# Patient Record
Sex: Female | Born: 1960 | Race: White | Hispanic: No | Marital: Married | Smoking: Former smoker
Health system: Southern US, Community
[De-identification: ages and names within clinical notes are randomized; demographics above are authoritative.]

## PROBLEM LIST (undated history)

## (undated) DIAGNOSIS — J45909 Unspecified asthma, uncomplicated: Secondary | ICD-10-CM

## (undated) DIAGNOSIS — N879 Dysplasia of cervix uteri, unspecified: Secondary | ICD-10-CM

## (undated) DIAGNOSIS — F419 Anxiety disorder, unspecified: Secondary | ICD-10-CM

## (undated) DIAGNOSIS — T7840XA Allergy, unspecified, initial encounter: Secondary | ICD-10-CM

## (undated) HISTORY — PX: LASER ABLATION OF THE CERVIX: SHX1949

## (undated) HISTORY — DX: Dysplasia of cervix uteri, unspecified: N87.9

## (undated) HISTORY — PX: DILATION AND CURETTAGE OF UTERUS: SHX78

## (undated) HISTORY — PX: POLYPECTOMY: SHX149

## (undated) HISTORY — DX: Unspecified asthma, uncomplicated: J45.909

## (undated) HISTORY — DX: Anxiety disorder, unspecified: F41.9

## (undated) HISTORY — PX: ENDOMETRIAL ABLATION: SHX621

## (undated) HISTORY — DX: Allergy, unspecified, initial encounter: T78.40XA

## (undated) HISTORY — PX: PELVIC LAPAROSCOPY: SHX162

## (undated) HISTORY — PX: COLONOSCOPY: SHX174

## (undated) HISTORY — PX: GYNECOLOGIC CRYOSURGERY: SHX857

---

## 1992-01-28 HISTORY — PX: COLPOSCOPY: SHX161

## 1999-12-05 ENCOUNTER — Other Ambulatory Visit: Admission: RE | Admit: 1999-12-05 | Discharge: 1999-12-05 | Payer: Self-pay | Admitting: Internal Medicine

## 2000-11-27 ENCOUNTER — Other Ambulatory Visit: Admission: RE | Admit: 2000-11-27 | Discharge: 2000-11-27 | Payer: Self-pay | Admitting: Obstetrics and Gynecology

## 2001-03-26 ENCOUNTER — Ambulatory Visit (HOSPITAL_COMMUNITY): Admission: RE | Admit: 2001-03-26 | Discharge: 2001-03-26 | Payer: Self-pay | Admitting: Obstetrics and Gynecology

## 2002-09-09 ENCOUNTER — Other Ambulatory Visit: Admission: RE | Admit: 2002-09-09 | Discharge: 2002-09-09 | Payer: Self-pay | Admitting: Obstetrics and Gynecology

## 2003-09-15 ENCOUNTER — Ambulatory Visit (HOSPITAL_COMMUNITY): Admission: RE | Admit: 2003-09-15 | Discharge: 2003-09-15 | Payer: Self-pay | Admitting: Obstetrics and Gynecology

## 2005-11-07 ENCOUNTER — Ambulatory Visit (HOSPITAL_COMMUNITY): Admission: RE | Admit: 2005-11-07 | Discharge: 2005-11-07 | Payer: Self-pay | Admitting: Obstetrics and Gynecology

## 2005-11-18 ENCOUNTER — Other Ambulatory Visit: Admission: RE | Admit: 2005-11-18 | Discharge: 2005-11-18 | Payer: Self-pay | Admitting: Obstetrics and Gynecology

## 2006-11-20 ENCOUNTER — Other Ambulatory Visit: Admission: RE | Admit: 2006-11-20 | Discharge: 2006-11-20 | Payer: Self-pay | Admitting: Obstetrics and Gynecology

## 2007-02-09 ENCOUNTER — Ambulatory Visit: Payer: Self-pay | Admitting: Internal Medicine

## 2007-02-09 DIAGNOSIS — R05 Cough: Secondary | ICD-10-CM | POA: Insufficient documentation

## 2007-02-09 DIAGNOSIS — J309 Allergic rhinitis, unspecified: Secondary | ICD-10-CM | POA: Insufficient documentation

## 2007-03-24 ENCOUNTER — Ambulatory Visit: Payer: Self-pay | Admitting: Internal Medicine

## 2007-03-25 ENCOUNTER — Encounter: Payer: Self-pay | Admitting: Internal Medicine

## 2007-12-21 ENCOUNTER — Ambulatory Visit: Payer: Self-pay | Admitting: Obstetrics and Gynecology

## 2007-12-21 ENCOUNTER — Other Ambulatory Visit: Admission: RE | Admit: 2007-12-21 | Discharge: 2007-12-21 | Payer: Self-pay | Admitting: Obstetrics and Gynecology

## 2007-12-21 ENCOUNTER — Encounter: Payer: Self-pay | Admitting: Obstetrics and Gynecology

## 2008-04-24 ENCOUNTER — Ambulatory Visit: Payer: Self-pay | Admitting: Obstetrics and Gynecology

## 2008-08-21 ENCOUNTER — Ambulatory Visit: Payer: Self-pay | Admitting: Obstetrics and Gynecology

## 2008-12-05 ENCOUNTER — Ambulatory Visit (HOSPITAL_COMMUNITY): Admission: RE | Admit: 2008-12-05 | Discharge: 2008-12-05 | Payer: Self-pay | Admitting: Obstetrics and Gynecology

## 2009-03-30 ENCOUNTER — Other Ambulatory Visit: Admission: RE | Admit: 2009-03-30 | Discharge: 2009-03-30 | Payer: Self-pay | Admitting: Obstetrics and Gynecology

## 2009-03-30 ENCOUNTER — Ambulatory Visit: Payer: Self-pay | Admitting: Obstetrics and Gynecology

## 2009-06-07 ENCOUNTER — Ambulatory Visit: Payer: Self-pay | Admitting: Obstetrics and Gynecology

## 2009-06-13 ENCOUNTER — Ambulatory Visit: Payer: Self-pay | Admitting: Obstetrics and Gynecology

## 2010-02-17 ENCOUNTER — Encounter: Payer: Self-pay | Admitting: Internal Medicine

## 2010-06-04 ENCOUNTER — Other Ambulatory Visit (HOSPITAL_COMMUNITY)
Admission: RE | Admit: 2010-06-04 | Discharge: 2010-06-04 | Disposition: A | Discharge status: Home / Self Care | Payer: PRIVATE HEALTH INSURANCE | Source: Ambulatory Visit | Attending: Obstetrics and Gynecology | Admitting: Obstetrics and Gynecology

## 2010-06-04 ENCOUNTER — Encounter (INDEPENDENT_AMBULATORY_CARE_PROVIDER_SITE_OTHER): Payer: PRIVATE HEALTH INSURANCE | Admitting: Obstetrics and Gynecology

## 2010-06-04 ENCOUNTER — Other Ambulatory Visit: Payer: Self-pay | Admitting: Obstetrics and Gynecology

## 2010-06-04 DIAGNOSIS — R823 Hemoglobinuria: Secondary | ICD-10-CM

## 2010-06-04 DIAGNOSIS — Z01419 Encounter for gynecological examination (general) (routine) without abnormal findings: Secondary | ICD-10-CM

## 2010-06-04 DIAGNOSIS — Z833 Family history of diabetes mellitus: Secondary | ICD-10-CM

## 2010-06-04 DIAGNOSIS — Z124 Encounter for screening for malignant neoplasm of cervix: Secondary | ICD-10-CM | POA: Insufficient documentation

## 2010-06-14 NOTE — Op Note (Signed)
[facility]  Patient:    Shelly Mcgee, Shelly Mcgee Visit Number: [id number] MRN: [id number]          Service Type: DSU Location: DAY Attending Physician:  Sharon Mt Dictated by:   Rande Brunt. Eda Paschal, M.D. Proc. Date: 03/26/01 Admit Date:  03/26/2001                             Operative Report  PREOPERATIVE DIAGNOSIS:  Pelvic pain with endometriosis suspected.  POSTOPERATIVE DIAGNOSIS:  Pelvic pain, endometriosis.  OPERATION PERFORMED:  Diagnostic laparoscopy with laser vaporization of endometriosis.  SURGEON:  Daniel L. Eda Paschal, M.D.  ANESTHESIA:  General endotracheal.  INDICATIONS FOR PROCEDURE:  The patient is a 50 year old female with progressively increasing right lower quadrant pain who enters the hospital for laparoscopy and appropriate treatment. She has a past history of endometriosis and therefore this is the most likely diagnosis. In addition, she underwent laparoscopic examination of an endometrioma and the other thought is that she may have pelvic adhesive disease from a previous ovarian cystectomy. She now enters for the above.  FINDINGS:  At the time of laparoscopy, the patient was completely devoid of any pelvic adhesive disease except the vesicouterine fold to peritoneum had some from a previous cesarean section. The patient had 5 distinct areas of endometriosis that were all nonpigmented. They were either clear or reddish. The largest area was in the cul-de-sac on the right and was about 3 cm. It was more superficial than deep. There was also an area of endometriosis on 2 or 3 spots of her uterus again more superficial then deep. The fifth area was on her left ovary and again was more superficial than deep. Total areas involved probably cumulatively was about 5 cm worth. The fallopian tubes were normal. The fimbria were normal with luxuriant fimbria, tubes were patent when indigo carmine was utilized. The ileocecal junction  was identified and the appendix was normal. The right upper quadrant was visualized and it was normal.  DESCRIPTION OF PROCEDURE:  After adequate general endotracheal anesthesia, the patient was placed in the dorsal lithotomy position and prepped and draped in the usual sterile manner. A Robinson catheter was used to empty the bladder, a Jarcho cannula was placed in the uterus, a pneumoperitoneum was created with a Veress needle, 3.5 liters of carbon dioxide were utilized. A 10 mm trocar was placed subumbilically. A 5 mm port was placed in the pelvis under direct visualization once the operating scope with the camera was placed. The pelvis was visualized and was noted above. Pictures were taken for documentation. The neodymium YAG laser using a GR4 tip at 12 watts power was utilized. All areas of endometriosis were completely lasered, none of them were near dangerous surrounding structures. Copious irrigation was done with Ringers lactate. At the termination of the procedure, there was no bleeding noted. All endometriosis was gone, cul-de-sac fluid was removed. Chromotubation was done with indigo carmine and both tubes were patent. At the termination of the procedure, the pneumoperitoneum was evacuated, the subumbilical fascial incision was closed with #0 Vicryl and the skin incisions were closed with 4-0 monocryl. The patient left the operating room in satisfactory condition. Blood loss was less than 50 cc. Dictated by:   Rande Brunt. Eda Paschal, M.D. Attending Physician:  Sharon Mt DD:  03/26/01 TD:  03/26/01 Job: [phone] [id number]

## 2010-12-26 ENCOUNTER — Other Ambulatory Visit: Payer: Self-pay

## 2010-12-26 MED ORDER — TERCONAZOLE 0.8 % VA CREA: 1.0000 | TOPICAL_CREAM | Freq: Every day | VAGINAL | Status: AC

## 2010-12-26 NOTE — Telephone Encounter (Signed)
RX SENT TO HER PHARMACY.

## 2010-12-26 NOTE — Telephone Encounter (Signed)
Okay to give her generic Terazol 3 cream with 5 refills.

## 2010-12-26 NOTE — Telephone Encounter (Signed)
C-O VAGINAL ITCHING AND REQUESTING REFILLS ON GENERIC TERAZOL 3 YOU GAVE HER LAST YEAR THAT HAVE EXPIRED.

## 2011-05-21 ENCOUNTER — Other Ambulatory Visit: Payer: Self-pay | Admitting: Obstetrics and Gynecology

## 2011-05-21 DIAGNOSIS — Z1231 Encounter for screening mammogram for malignant neoplasm of breast: Secondary | ICD-10-CM

## 2011-06-18 ENCOUNTER — Ambulatory Visit (HOSPITAL_COMMUNITY)
Admission: RE | Admit: 2011-06-18 | Discharge: 2011-06-18 | Disposition: A | Discharge status: Home / Self Care | Payer: PRIVATE HEALTH INSURANCE | Source: Ambulatory Visit | Attending: Obstetrics and Gynecology | Admitting: Obstetrics and Gynecology

## 2011-06-18 DIAGNOSIS — Z1231 Encounter for screening mammogram for malignant neoplasm of breast: Secondary | ICD-10-CM

## 2011-06-27 ENCOUNTER — Encounter: Payer: Self-pay | Admitting: Gynecology

## 2011-06-27 DIAGNOSIS — N809 Endometriosis, unspecified: Secondary | ICD-10-CM | POA: Insufficient documentation

## 2011-07-10 ENCOUNTER — Other Ambulatory Visit (HOSPITAL_COMMUNITY)
Admission: RE | Admit: 2011-07-10 | Discharge: 2011-07-10 | Disposition: A | Discharge status: Home / Self Care | Payer: PRIVATE HEALTH INSURANCE | Source: Ambulatory Visit | Attending: Obstetrics and Gynecology | Admitting: Obstetrics and Gynecology

## 2011-07-10 ENCOUNTER — Ambulatory Visit (INDEPENDENT_AMBULATORY_CARE_PROVIDER_SITE_OTHER): Payer: PRIVATE HEALTH INSURANCE | Admitting: Obstetrics and Gynecology

## 2011-07-10 ENCOUNTER — Encounter: Payer: Self-pay | Admitting: Obstetrics and Gynecology

## 2011-07-10 VITALS — BP 134/84 | Ht 66.0 in | Wt 148.0 lb

## 2011-07-10 DIAGNOSIS — Z01419 Encounter for gynecological examination (general) (routine) without abnormal findings: Secondary | ICD-10-CM

## 2011-07-10 DIAGNOSIS — N879 Dysplasia of cervix uteri, unspecified: Secondary | ICD-10-CM | POA: Insufficient documentation

## 2011-07-10 DIAGNOSIS — F419 Anxiety disorder, unspecified: Secondary | ICD-10-CM | POA: Insufficient documentation

## 2011-07-10 DIAGNOSIS — Z833 Family history of diabetes mellitus: Secondary | ICD-10-CM

## 2011-07-10 LAB — CBC WITH DIFFERENTIAL/PLATELET
Basophils Absolute: 0.1 10*3/uL (ref 0.0–0.1)
Basophils Relative: 1 % (ref 0–1)
Eosinophils Absolute: 0 10*3/uL (ref 0.0–0.7)
Eosinophils Relative: 0 % (ref 0–5)
HCT: 40.7 % (ref 36.0–46.0)
Hemoglobin: 13.6 g/dL (ref 12.0–15.0)
Lymphocytes Relative: 22 % (ref 12–46)
MCH: 30.4 pg (ref 26.0–34.0)
MCHC: 33.4 g/dL (ref 30.0–36.0)
Platelets: 187 10*3/uL (ref 150–400)
RBC: 4.47 MIL/uL (ref 3.87–5.11)
RDW: 12.8 % (ref 11.5–15.5)

## 2011-07-10 LAB — HEMOGLOBIN A1C
Hgb A1c MFr Bld: 5.3 % (ref <5.7)
Mean Plasma Glucose: 105 mg/dL (ref <117)

## 2011-07-10 NOTE — Progress Notes (Signed)
Patient came to see me today for her annual GYN exam. Her cycles are regular and are not uncomfortable. She does have occasional night sweats. She is under a lot of stress with her daughter. She is having no pelvic pain. She is having no abnormal bleeding. She is up-to-date on mammograms. 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 She understands the new guidelines and requested a Pap today.  Physical examination: Kennon Portela present. HEENT within normal limits. Neck: Thyroid not large. No masses. Supraclavicular nodes: not enlarged. Breasts: Examined in both sitting and lying  position. No skin changes and no masses. Abdomen: Soft no guarding rebound or masses or hernia. Pelvic: External: Within normal limits. BUS: Within normal limits. Vaginal:within normal limits. Good estrogen effect. No evidence of cystocele rectocele or enterocele. Cervix: clean. Uterus: Normal size and shape. Adnexa: No masses. Rectovaginal exam: Confirmatory and negative. Extremities: Within normal limits.  Assessment: Normal GYN exam  Plan: Appropriate lab work done. Continue yearly mammograms. 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

## 2011-07-10 NOTE — Addendum Note (Signed)
Addended by: Dayna Barker on: 07/10/2011 09:34 AM   Modules accepted: Orders

## 2011-07-11 LAB — URINALYSIS W MICROSCOPIC + REFLEX CULTURE
Bilirubin Urine: NEGATIVE
Nitrite: NEGATIVE
Protein, ur: NEGATIVE mg/dL
Specific Gravity, Urine: 1.023 (ref 1.005–1.030)
Urobilinogen, UA: 0.2 mg/dL (ref 0.0–1.0)

## 2011-07-11 LAB — LIPID PANEL: HDL: 62 mg/dL (ref >39)

## 2011-07-13 LAB — URINE CULTURE: Colony Count: 40000

## 2011-07-14 ENCOUNTER — Other Ambulatory Visit: Payer: Self-pay | Admitting: Obstetrics and Gynecology

## 2011-07-14 DIAGNOSIS — N390 Urinary tract infection, site not specified: Secondary | ICD-10-CM

## 2011-07-14 MED ORDER — NITROFURANTOIN MONOHYD MACRO 100 MG PO CAPS: 100.0000 mg | ORAL_CAPSULE | Freq: Two times a day (BID) | ORAL | Status: DC

## 2011-07-21 ENCOUNTER — Other Ambulatory Visit: Payer: PRIVATE HEALTH INSURANCE

## 2011-07-21 DIAGNOSIS — N390 Urinary tract infection, site not specified: Secondary | ICD-10-CM

## 2011-07-22 LAB — URINALYSIS W MICROSCOPIC + REFLEX CULTURE
Hgb urine dipstick: NEGATIVE
Ketones, ur: NEGATIVE mg/dL
Leukocytes, UA: NEGATIVE
Nitrite: NEGATIVE
Protein, ur: NEGATIVE mg/dL
Urobilinogen, UA: 0.2 mg/dL (ref 0.0–1.0)

## 2011-08-08 ENCOUNTER — Telehealth: Payer: Self-pay | Admitting: *Deleted

## 2011-08-08 NOTE — Telephone Encounter (Signed)
Pt informed of recent results, copy mailed to pt as well.

## 2011-11-29 ENCOUNTER — Other Ambulatory Visit: Payer: Self-pay | Admitting: Obstetrics and Gynecology

## 2011-12-23 ENCOUNTER — Encounter: Payer: Self-pay | Admitting: Family Medicine

## 2012-03-24 ENCOUNTER — Ambulatory Visit (INDEPENDENT_AMBULATORY_CARE_PROVIDER_SITE_OTHER): Payer: PRIVATE HEALTH INSURANCE | Admitting: Emergency Medicine

## 2012-03-24 VITALS — BP 151/96 | HR 58 | Temp 97.9°F | Resp 16 | Ht 65.5 in | Wt 148.8 lb

## 2012-03-24 DIAGNOSIS — N201 Calculus of ureter: Secondary | ICD-10-CM

## 2012-03-24 DIAGNOSIS — R350 Frequency of micturition: Secondary | ICD-10-CM

## 2012-03-24 LAB — POCT UA - MICROSCOPIC ONLY
Casts, Ur, LPF, POC: NEGATIVE
Mucus, UA: NEGATIVE
Yeast, UA: NEGATIVE

## 2012-03-24 LAB — POCT URINALYSIS DIPSTICK
Bilirubin, UA: NEGATIVE
Ketones, UA: NEGATIVE
Leukocytes, UA: NEGATIVE
Nitrite, UA: NEGATIVE

## 2012-03-24 NOTE — Patient Instructions (Signed)
Ureteral Colic (Kidney Stones) Ureteral colic is the result of a condition when kidney stones form inside the kidney. Once kidney stones are formed they may move into the tube that connects the kidney with the bladder (ureter). If this occurs, this condition may cause pain (colic) in the ureter.  CAUSES  Pain is caused by stone movement in the ureter and the obstruction caused by the stone. SYMPTOMS  The pain comes and goes as the ureter contracts around the stone. The pain is usually intense, sharp, and stabbing in character. The location of the pain may move as the stone moves through the ureter. When the stone is near the kidney the pain is usually located in the back and radiates to the belly (abdomen). When the stone is ready to pass into the bladder the pain is often located in the lower abdomen on the side the stone is located. At this location, the symptoms may mimic those of a urinary tract infection with urinary frequency. Once the stone is located here it often passes into the bladder and the pain disappears completely. TREATMENT   Your caregiver will provide you with medicine for pain relief.  You may require specialized follow-up X-rays.  The absence of pain does not always mean that the stone has passed. It may have just stopped moving. If the urine remains completely obstructed, it can cause loss of kidney function or even complete destruction of the involved kidney. It is your responsibility and in your interest that X-rays and follow-ups as suggested by your caregiver are completed. Relief of pain without passage of the stone can be associated with severe damage to the kidney, including loss of kidney function on that side.  If your stone does not pass on its own, additional measures may be taken by your caregiver to ensure its removal. HOME CARE INSTRUCTIONS   Increase your fluid intake. Water is the preferred fluid since juices containing vitamin C may acidify the urine making it  less likely for certain stones (uric acid stones) to pass.  Strain all urine. A strainer will be provided. Keep all particulate matter or stones for your caregiver to inspect.  Take your pain medicine as directed.  Make a follow-up appointment with your caregiver as directed.  Remember that the goal is passage of your stone. The absence of pain does not mean the stone is gone. Follow your caregiver's instructions.  Only take over-the-counter or prescription medicines for pain, discomfort, or fever as directed by your caregiver. SEEK MEDICAL CARE IF:   Pain cannot be controlled with the prescribed medicine.  You have a fever.  Pain continues for longer than your caregiver advises it should.  There is a change in the pain, and you develop chest discomfort or constant abdominal pain.  You feel faint or pass out. MAKE SURE YOU:   Understand these instructions.  Will watch your condition.  Will get help right away if you are not doing well or get worse. Document Released: 10/23/2004 Document Revised: 04/07/2011 Document Reviewed: 07/10/2010 Exit[employer] Patient Information 2013 [employer].  

## 2012-03-24 NOTE — Addendum Note (Signed)
Addended by: Carmelina Dane on: 03/24/2012 08:50 PM   Modules accepted: Orders

## 2012-03-24 NOTE — Progress Notes (Signed)
[facility] [address] [place] [address] [phone]  Date:  03/24/2012   Name:  Shelly Mcgee   DOB:  1960/03/03   MRN:  [id number]  PCP:  Trellis Paganini, MD    Chief Complaint: Urinary Tract Infection   History of Present Illness:  Shelly Mcgee is a 52 y.o. very pleasant female patient who presents with the following:  Dysuria urgency and frequency for past several days despite self treatment with macrobid.  Started with severe left flank pain on Sunday that kept her awake a good part of the night.  History of frequent cystitis in past.  Now has malaise and myalgias, fatigue.  Some back pain into left groin.  No dyspareunia, discharge or vaginal bleeding.  Patient Active Problem List  Diagnosis  . ALLERGIC RHINITIS  . COUGH  . Endometriosis  . Anxiety  . Cervical dysplasia    Past Medical History  Diagnosis Date  . Endometriosis   . Anxiety   . Cervical dysplasia     Past Surgical History  Procedure Laterality Date  . Cesarean section    . Pelvic laparoscopy      DL laser endometriosis  . Laser ablation of the cervix    . Dilation and curettage of uterus    . Endometrial ablation    . Gynecologic cryosurgery    . Colposcopy  1994    History  Substance Use Topics  . Smoking status: Former Games developer  . Smokeless tobacco: Not on file  . Alcohol Use: 3.0 oz/week    6 drink(s) per week    Family History  Problem Relation Age of Onset  . Diabetes Mother   . Hypertension Mother   . Diabetes Father   . Diabetes Brother   . Diabetes Maternal Grandmother   . Diabetes Maternal Aunt   . Diabetes Maternal Uncle     Allergies  Allergen Reactions  . Wasp Venom   . Yellow Jacket Venom (Bee Venom)     Medication list has been reviewed and updated.  Current Outpatient Prescriptions on File Prior to Visit  Medication Sig Dispense Refill  . glucosamine-chondroitin 500-400 MG tablet Take 1 tablet by mouth daily.      . IBUPROFEN PO  Take by mouth.      . nitrofurantoin, macrocrystal-monohydrate, (MACROBID) 100 MG capsule TAKE ONE CAPSULE BY MOUTH TWICE DAILY WITH FOOD FOR 7 DAYS  14 capsule  0   No current facility-administered medications on file prior to visit.    Review of Systems:  As per HPI, otherwise negative.    Physical Examination: Filed Vitals:   03/24/12 2014  BP: 151/96  Pulse: 58  Temp: 97.9 F (36.6 C)  Resp: 16   Filed Vitals:   03/24/12 2014  Height: 5' 5.5" (1.664 m)  Weight: 148 lb 12.8 oz (67.495 kg)   Body mass index is 24.38 kg/(m^2). Ideal Body Weight: Weight in (lb) to have BMI = 25: 152.2  GEN: WDWN, NAD, Non-toxic, A & O x 3 HEENT: Atraumatic, Normocephalic. Neck supple. No masses, No LAD. Ears and Nose: No external deformity. CV: RRR, No M/G/R. No JVD. No thrill. No extra heart sounds. PULM: CTA B, no wheezes, crackles, rhonchi. No retractions. No resp. distress. No accessory muscle use. ABD: S, NT, ND, +BS. No rebound. No HSM. EXTR: No c/c/e NEURO Normal gait.  PSYCH: Normally interactive. Conversant. Not depressed or anxious appearing.  Calm demeanor.    Assessment and Plan:  Hematuria Resolved flank pain Likely ureterolithiasis Follow up two weeks for repeat UA Urine C&S   Carmelina Dane, MD   Results for orders placed in visit on 03/24/12  POCT URINALYSIS DIPSTICK      Result Value Range   Color, UA yellow     Clarity, UA clear     Glucose, UA neg     Bilirubin, UA neg     Ketones, UA neg     Spec Grav, UA >=1.030     Blood, UA large     pH, UA 6.0     Protein, UA neg'     Urobilinogen, UA 0.2     Nitrite, UA neg     Leukocytes, UA Negative    POCT UA - MICROSCOPIC ONLY      Result Value Range   WBC, Ur, HPF, POC 0-2     RBC, urine, microscopic tntc     Bacteria, U Microscopic 1+     Mucus, UA neg     Epithelial cells, urine per micros 0-1     Crystals, Ur, HPF, POC neg     Casts, Ur, LPF, POC neg     Yeast, UA neg

## 2012-03-25 ENCOUNTER — Ambulatory Visit: Payer: Self-pay | Admitting: Physician Assistant

## 2012-03-27 LAB — URINE CULTURE: Organism ID, Bacteria: NO GROWTH

## 2012-04-09 ENCOUNTER — Ambulatory Visit: Payer: PRIVATE HEALTH INSURANCE | Admitting: Family Medicine

## 2012-07-12 ENCOUNTER — Encounter: Payer: PRIVATE HEALTH INSURANCE | Admitting: Family Medicine

## 2012-08-03 ENCOUNTER — Encounter: Payer: Self-pay | Admitting: Family Medicine

## 2012-08-03 ENCOUNTER — Other Ambulatory Visit (HOSPITAL_COMMUNITY)
Admission: RE | Admit: 2012-08-03 | Discharge: 2012-08-03 | Disposition: A | Discharge status: Home / Self Care | Payer: PRIVATE HEALTH INSURANCE | Source: Ambulatory Visit | Attending: Family Medicine | Admitting: Family Medicine

## 2012-08-03 ENCOUNTER — Ambulatory Visit (INDEPENDENT_AMBULATORY_CARE_PROVIDER_SITE_OTHER): Payer: PRIVATE HEALTH INSURANCE | Admitting: Family Medicine

## 2012-08-03 VITALS — BP 126/78 | HR 63 | Temp 98.4°F | Ht 65.5 in | Wt 144.6 lb

## 2012-08-03 DIAGNOSIS — H1045 Other chronic allergic conjunctivitis: Secondary | ICD-10-CM

## 2012-08-03 DIAGNOSIS — Z01419 Encounter for gynecological examination (general) (routine) without abnormal findings: Secondary | ICD-10-CM | POA: Insufficient documentation

## 2012-08-03 DIAGNOSIS — Z124 Encounter for screening for malignant neoplasm of cervix: Secondary | ICD-10-CM

## 2012-08-03 DIAGNOSIS — J309 Allergic rhinitis, unspecified: Secondary | ICD-10-CM

## 2012-08-03 DIAGNOSIS — R05 Cough: Secondary | ICD-10-CM

## 2012-08-03 DIAGNOSIS — Z0000 Encounter for general adult medical examination without abnormal findings: Secondary | ICD-10-CM

## 2012-08-03 DIAGNOSIS — Z1151 Encounter for screening for human papillomavirus (HPV): Secondary | ICD-10-CM | POA: Insufficient documentation

## 2012-08-03 DIAGNOSIS — H1010 Acute atopic conjunctivitis, unspecified eye: Secondary | ICD-10-CM

## 2012-08-03 DIAGNOSIS — J302 Other seasonal allergic rhinitis: Secondary | ICD-10-CM

## 2012-08-03 LAB — HEPATIC FUNCTION PANEL
ALT: 16 U/L (ref 0–35)
AST: 25 U/L (ref 0–37)
Albumin: 4.4 g/dL (ref 3.5–5.2)
Alkaline Phosphatase: 42 U/L (ref 39–117)
Total Protein: 7.2 g/dL (ref 6.0–8.3)

## 2012-08-03 LAB — POCT URINALYSIS DIPSTICK
Bilirubin, UA: NEGATIVE
Glucose, UA: NEGATIVE
Ketones, UA: NEGATIVE
Leukocytes, UA: NEGATIVE
Protein, UA: NEGATIVE
Spec Grav, UA: 1.015

## 2012-08-03 LAB — CBC WITH DIFFERENTIAL/PLATELET
Basophils Relative: 1.4 % (ref 0.0–3.0)
Eosinophils Relative: 0.3 % (ref 0.0–5.0)
HCT: 38.1 % (ref 36.0–46.0)
Hemoglobin: 12.9 g/dL (ref 12.0–15.0)
Lymphocytes Relative: 24.5 % (ref 12.0–46.0)
Lymphs Abs: 1.3 10*3/uL (ref 0.7–4.0)
Monocytes Relative: 7.6 % (ref 3.0–12.0)
Neutro Abs: 3.6 10*3/uL (ref 1.4–7.7)
RBC: 4.12 Mil/uL (ref 3.87–5.11)
RDW: 12.7 % (ref 11.5–14.6)

## 2012-08-03 LAB — BASIC METABOLIC PANEL
CO2: 24 mEq/L (ref 19–32)
Calcium: 9.1 mg/dL (ref 8.4–10.5)
GFR: 93.83 mL/min (ref >60.00)
Glucose, Bld: 94 mg/dL (ref 70–99)
Potassium: 3.5 mEq/L (ref 3.5–5.1)
Sodium: 138 mEq/L (ref 135–145)

## 2012-08-03 LAB — LIPID PANEL
Cholesterol: 181 mg/dL (ref 0–200)
VLDL: 13.2 mg/dL (ref 0.0–40.0)

## 2012-08-03 LAB — TSH: TSH: 1.17 u[IU]/mL (ref 0.35–5.50)

## 2012-08-03 MED ORDER — RANITIDINE HCL 150 MG PO TABS: 150.0000 mg | ORAL_TABLET | Freq: Two times a day (BID) | ORAL | Status: DC

## 2012-08-03 NOTE — Progress Notes (Signed)
Subjective:     Shelly Mcgee is a 52 y.o. female and is here for a comprehensive physical exam. The patient reports no current problems-- she occassionally has LB pain from bulging disc at L5S1.---she is also taking zantac 150 qd for dyspepsia and using saline rinse for allergies  History   Social History  . Marital Status: Married    Spouse Name: N/A    Number of Children: N/A  . Years of Education: N/A   Occupational History  . [employer]--[address]    Social History Main Topics  . Smoking status: Former Smoker -- 0.50 packs/day for 6 years    Quit date: 08/04/1982  . Smokeless tobacco: Not on file  . Alcohol Use: 3.0 oz/week    6 drink(s) per week  . Drug Use: No  . Sexually Active: Yes -- Female partner(s)    Birth Control/ Protection: None   Other Topics Concern  . Not on file   Social History Narrative   Exercise-- yoga 3x aweek ,  Walks 10 miles a week   Health Maintenance  Topic Date Due  . Mammogram  10/03/2011  . Colonoscopy  10/03/2011  . Pap Smear  07/09/2012  . Influenza Vaccine  09/27/2012  . Tetanus/tdap  06/03/2020    The following portions of the patient's history were reviewed and updated as appropriate:  She  has a past medical history of Endometriosis; Anxiety; and Cervical dysplasia. She  does not have any pertinent problems on file. She  has past surgical history that includes Cesarean section; Pelvic laparoscopy; Laser ablation of the cervix; Dilation and curettage of uterus; Endometrial ablation; Gynecologic cryosurgery; and Colposcopy (1994). Her family history includes Diabetes in her brother, father, maternal aunt, maternal grandmother, maternal uncle, and mother; Hypertension in her mother; and Thyroid disease in her paternal grandmother and sister. She  reports that she quit smoking about 30 years ago. She does not have any smokeless tobacco history on file. She reports that she drinks about 3.0 ounces of alcohol per week. She reports that  she does not use illicit drugs. She has a current medication list which includes the following prescription(s): glucosamine-chondroitin, ibuprofen, and ranitidine. Current Outpatient Prescriptions on File Prior to Visit  Medication Sig Dispense Refill  . glucosamine-chondroitin 500-400 MG tablet Take 1 tablet by mouth daily.      . IBUPROFEN PO Take by mouth.       No current facility-administered medications on file prior to visit.   She is allergic to wasp venom and yellow jacket venom..  Review of Systems Review of Systems  Constitutional: Negative for activity change, appetite change and fatigue.  HENT: Negative for hearing loss, congestion, tinnitus and ear discharge.  dentist q56m Eyes: Negative for visual disturbance (see optho- due).  Respiratory: Negative for cough, chest tightness and shortness of breath.   Cardiovascular: Negative for chest pain, palpitations and leg swelling.  Gastrointestinal: Negative for abdominal pain, diarrhea, constipation and abdominal distention.  Genitourinary: Negative for urgency, frequency, decreased urine volume and difficulty urinating.  Musculoskeletal: Negative for back pain, arthralgias and gait problem.  Skin: Negative for color change, pallor and rash.  Neurological: Negative for dizziness, light-headedness, numbness and headaches.  Hematological: Negative for adenopathy. Does not bruise/bleed easily.  Psychiatric/Behavioral: Negative for suicidal ideas, confusion, sleep disturbance, self-injury, dysphoric mood, decreased concentration and agitation.       Objective:    BP 126/78  Pulse 63  Temp(Src) 98.4 F (36.9 C) (Oral)  Ht 5'  5.5" (1.664 m)  Wt 144 lb 9.6 oz (65.59 kg)  BMI 23.69 kg/m2  SpO2 98%  LMP 07/20/2012 General appearance: alert, cooperative, appears stated age and no distress Head: Normocephalic, without obvious abnormality, atraumatic Eyes: conjunctivae/corneas clear. PERRL, EOM's intact. Fundi benign. Ears:  normal TM's and external ear canals both ears Nose: Nares normal. Septum midline. Mucosa normal. No drainage or sinus tenderness. Throat: lips, mucosa, and tongue normal; teeth and gums normal Neck: no adenopathy, no carotid bruit, no JVD, supple, symmetrical, trachea midline and thyroid not enlarged, symmetric, no tenderness/mass/nodules Back: symmetric, no curvature. ROM normal. No CVA tenderness. Lungs: clear to auscultation bilaterally Breasts: normal appearance, no masses or tenderness Heart: regular rate and rhythm, S1, S2 normal, no murmur, click, rub or gallop Abdomen: soft, non-tender; bowel sounds normal; no masses,  no organomegaly Pelvic: cervix normal in appearance, external genitalia normal, no adnexal masses or tenderness, no cervical motion tenderness, rectovaginal septum normal, uterus normal size, shape, and consistency and vagina normal without discharge-pap done Extremities: extremities normal, atraumatic, no cyanosis or edema Pulses: 2+ and symmetric Skin: Skin color, texture, turgor normal. No rashes or lesions Lymph nodes: Cervical, supraclavicular, and axillary nodes normal. Neurologic: Alert and oriented X 3, normal strength and tone. Normal symmetric reflexes. Normal coordination and gait Psych-- no depression, no anxiety      Assessment:    Healthy female exam.      Plan:    check labs ghm ut See After Visit Summary for Counseling Recommendations

## 2012-08-03 NOTE — Patient Instructions (Addendum)
Preventive Care for Adults, Female A healthy lifestyle and preventive care can promote health and wellness. Preventive health guidelines for women include the following key practices.  A routine yearly physical is a good way to check with your caregiver about your health and preventive screening. It is a chance to share any concerns and updates on your health, and to receive a thorough exam.  Visit your dentist for a routine exam and preventive care every 6 months. Brush your teeth twice a day and floss once a day. Good oral hygiene prevents tooth decay and gum disease.  The frequency of eye exams is based on your age, health, family medical history, use of contact lenses, and other factors. Follow your caregiver's recommendations for frequency of eye exams.  Eat a healthy diet. Foods like vegetables, fruits, whole grains, low-fat dairy products, and lean protein foods contain the nutrients you need without too many calories. Decrease your intake of foods high in solid fats, added sugars, and salt. Eat the right amount of calories for you.Get information about a proper diet from your caregiver, if necessary.  Regular physical exercise is one of the most important things you can do for your health. Most adults should get at least 150 minutes of moderate-intensity exercise (any activity that increases your heart rate and causes you to sweat) each week. In addition, most adults need muscle-strengthening exercises on 2 or more days a week.  Maintain a healthy weight. The body mass index (BMI) is a screening tool to identify possible weight problems. It provides an estimate of body fat based on height and weight. Your caregiver can help determine your BMI, and can help you achieve or maintain a healthy weight.For adults 20 years and older:  A BMI below 18.5 is considered underweight.  A BMI of 18.5 to 24.9 is normal.  A BMI of 25 to 29.9 is considered overweight.  A BMI of 30 and above is  considered obese.  Maintain normal blood lipids and cholesterol levels by exercising and minimizing your intake of saturated fat. Eat a balanced diet with plenty of fruit and vegetables. Blood tests for lipids and cholesterol should begin at age 20 and be repeated every 5 years. If your lipid or cholesterol levels are high, you are over 50, or you are at high risk for heart disease, you may need your cholesterol levels checked more frequently.Ongoing high lipid and cholesterol levels should be treated with medicines if diet and exercise are not effective.  If you smoke, find out from your caregiver how to quit. If you do not use tobacco, do not start.  If you are pregnant, do not drink alcohol. If you are breastfeeding, be very cautious about drinking alcohol. If you are not pregnant and choose to drink alcohol, do not exceed 1 drink per day. One drink is considered to be 12 ounces (355 mL) of beer, 5 ounces (148 mL) of wine, or 1.5 ounces (44 mL) of liquor.  Avoid use of street drugs. Do not share needles with anyone. Ask for help if you need support or instructions about stopping the use of drugs.  High blood pressure causes heart disease and increases the risk of stroke. Your blood pressure should be checked at least every 1 to 2 years. Ongoing high blood pressure should be treated with medicines if weight loss and exercise are not effective.  If you are 55 to 51 years old, ask your caregiver if you should take aspirin to prevent strokes.  Diabetes   screening involves taking a blood sample to check your fasting blood sugar level. This should be done once every 3 years, after age 45, if you are within normal weight and without risk factors for diabetes. Testing should be considered at a younger age or be carried out more frequently if you are overweight and have at least 1 risk factor for diabetes.  Breast cancer screening is essential preventive care for women. You should practice "breast  self-awareness." This means understanding the normal appearance and feel of your breasts and may include breast self-examination. Any changes detected, no matter how small, should be reported to a caregiver. Women in their 20s and 30s should have a clinical breast exam (CBE) by a caregiver as part of a regular health exam every 1 to 3 years. After age 40, women should have a CBE every year. Starting at age 40, women should consider having a mammography (breast X-ray test) every year. Women who have a family history of breast cancer should talk to their caregiver about genetic screening. Women at a high risk of breast cancer should talk to their caregivers about having magnetic resonance imaging (MRI) and a mammography every year.  The Pap test is a screening test for cervical cancer. A Pap test can show cell changes on the cervix that might become cervical cancer if left untreated. A Pap test is a procedure in which cells are obtained and examined from the lower end of the uterus (cervix).  Women should have a Pap test starting at age 21.  Between ages 21 and 29, Pap tests should be repeated every 2 years.  Beginning at age 30, you should have a Pap test every 3 years as long as the past 3 Pap tests have been normal.  Some women have medical problems that increase the chance of getting cervical cancer. Talk to your caregiver about these problems. It is especially important to talk to your caregiver if a new problem develops soon after your last Pap test. In these cases, your caregiver may recommend more frequent screening and Pap tests.  The above recommendations are the same for women who have or have not gotten the vaccine for human papillomavirus (HPV).  If you had a hysterectomy for a problem that was not cancer or a condition that could lead to cancer, then you no longer need Pap tests. Even if you no longer need a Pap test, a regular exam is a good idea to make sure no other problems are  starting.  If you are between ages 65 and 70, and you have had normal Pap tests going back 10 years, you no longer need Pap tests. Even if you no longer need a Pap test, a regular exam is a good idea to make sure no other problems are starting.  If you have had past treatment for cervical cancer or a condition that could lead to cancer, you need Pap tests and screening for cancer for at least 20 years after your treatment.  If Pap tests have been discontinued, risk factors (such as a new sexual partner) need to be reassessed to determine if screening should be resumed.  The HPV test is an additional test that may be used for cervical cancer screening. The HPV test looks for the virus that can cause the cell changes on the cervix. The cells collected during the Pap test can be tested for HPV. The HPV test could be used to screen women aged 30 years and older, and should   be used in women of any age who have unclear Pap test results. After the age of 30, women should have HPV testing at the same frequency as a Pap test.  Colorectal cancer can be detected and often prevented. Most routine colorectal cancer screening begins at the age of 50 and continues through age 75. However, your caregiver may recommend screening at an earlier age if you have risk factors for colon cancer. On a yearly basis, your caregiver may provide home test kits to check for hidden blood in the stool. Use of a small camera at the end of a tube, to directly examine the colon (sigmoidoscopy or colonoscopy), can detect the earliest forms of colorectal cancer. Talk to your caregiver about this at age 50, when routine screening begins. Direct examination of the colon should be repeated every 5 to 10 years through age 75, unless early forms of pre-cancerous polyps or small growths are found.  Hepatitis C blood testing is recommended for all people born from 1945 through 1965 and any individual with known risks for hepatitis C.  Practice  safe sex. Use condoms and avoid high-risk sexual practices to reduce the spread of sexually transmitted infections (STIs). STIs include gonorrhea, chlamydia, syphilis, trichomonas, herpes, HPV, and human immunodeficiency virus (HIV). Herpes, HIV, and HPV are viral illnesses that have no cure. They can result in disability, cancer, and death. Sexually active women aged 25 and younger should be checked for chlamydia. Older women with new or multiple partners should also be tested for chlamydia. Testing for other STIs is recommended if you are sexually active and at increased risk.  Osteoporosis is a disease in which the bones lose minerals and strength with aging. This can result in serious bone fractures. The risk of osteoporosis can be identified using a bone density scan. Women ages 65 and over and women at risk for fractures or osteoporosis should discuss screening with their caregivers. Ask your caregiver whether you should take a calcium supplement or vitamin D to reduce the rate of osteoporosis.  Menopause can be associated with physical symptoms and risks. Hormone replacement therapy is available to decrease symptoms and risks. You should talk to your caregiver about whether hormone replacement therapy is right for you.  Use sunscreen with sun protection factor (SPF) of 30 or more. Apply sunscreen liberally and repeatedly throughout the day. You should seek shade when your shadow is shorter than you. Protect yourself by wearing long sleeves, pants, a wide-brimmed hat, and sunglasses year round, whenever you are outdoors.  Once a month, do a whole body skin exam, using a mirror to look at the skin on your back. Notify your caregiver of new moles, moles that have irregular borders, moles that are larger than a pencil eraser, or moles that have changed in shape or color.  Stay current with required immunizations.  Influenza. You need a dose every fall (or winter). The composition of the flu vaccine  changes each year, so being vaccinated once is not enough.  Pneumococcal polysaccharide. You need 1 to 2 doses if you smoke cigarettes or if you have certain chronic medical conditions. You need 1 dose at age 65 (or older) if you have never been vaccinated.  Tetanus, diphtheria, pertussis (Tdap, Td). Get 1 dose of Tdap vaccine if you are younger than age 65, are over 65 and have contact with an infant, are a healthcare worker, are pregnant, or simply want to be protected from whooping cough. After that, you need a Td   booster dose every 10 years. Consult your caregiver if you have not had at least 3 tetanus and diphtheria-containing shots sometime in your life or have a deep or dirty wound.  HPV. You need this vaccine if you are a woman age 26 or younger. The vaccine is given in 3 doses over 6 months.  Measles, mumps, rubella (MMR). You need at least 1 dose of MMR if you were born in 1957 or later. You may also need a second dose.  Meningococcal. If you are age 19 to 21 and a first-year college student living in a residence hall, or have one of several medical conditions, you need to get vaccinated against meningococcal disease. You may also need additional booster doses.  Zoster (shingles). If you are age 60 or older, you should get this vaccine.  Varicella (chickenpox). If you have never had chickenpox or you were vaccinated but received only 1 dose, talk to your caregiver to find out if you need this vaccine.  Hepatitis A. You need this vaccine if you have a specific risk factor for hepatitis A virus infection or you simply wish to be protected from this disease. The vaccine is usually given as 2 doses, 6 to 18 months apart.  Hepatitis B. You need this vaccine if you have a specific risk factor for hepatitis B virus infection or you simply wish to be protected from this disease. The vaccine is given in 3 doses, usually over 6 months. Preventive Services / Frequency Ages 19 to 39  Blood  pressure check.** / Every 1 to 2 years.  Lipid and cholesterol check.** / Every 5 years beginning at age 20.  Clinical breast exam.** / Every 3 years for women in their 20s and 30s.  Pap test.** / Every 2 years from ages 21 through 29. Every 3 years starting at age 30 through age 65 or 70 with a history of 3 consecutive normal Pap tests.  HPV screening.** / Every 3 years from ages 30 through ages 65 to 70 with a history of 3 consecutive normal Pap tests.  Hepatitis C blood test.** / For any individual with known risks for hepatitis C.  Skin self-exam. / Monthly.  Influenza immunization.** / Every year.  Pneumococcal polysaccharide immunization.** / 1 to 2 doses if you smoke cigarettes or if you have certain chronic medical conditions.  Tetanus, diphtheria, pertussis (Tdap, Td) immunization. / A one-time dose of Tdap vaccine. After that, you need a Td booster dose every 10 years.  HPV immunization. / 3 doses over 6 months, if you are 26 and younger.  Measles, mumps, rubella (MMR) immunization. / You need at least 1 dose of MMR if you were born in 1957 or later. You may also need a second dose.  Meningococcal immunization. / 1 dose if you are age 19 to 21 and a first-year college student living in a residence hall, or have one of several medical conditions, you need to get vaccinated against meningococcal disease. You may also need additional booster doses.  Varicella immunization.** / Consult your caregiver.  Hepatitis A immunization.** / Consult your caregiver. 2 doses, 6 to 18 months apart.  Hepatitis B immunization.** / Consult your caregiver. 3 doses usually over 6 months. Ages 40 to 64  Blood pressure check.** / Every 1 to 2 years.  Lipid and cholesterol check.** / Every 5 years beginning at age 20.  Clinical breast exam.** / Every year after age 40.  Mammogram.** / Every year beginning at age 40   and continuing for as long as you are in good health. Consult with your  caregiver.  Pap test.** / Every 3 years starting at age 30 through age 65 or 70 with a history of 3 consecutive normal Pap tests.  HPV screening.** / Every 3 years from ages 30 through ages 65 to 70 with a history of 3 consecutive normal Pap tests.  Fecal occult blood test (FOBT) of stool. / Every year beginning at age 50 and continuing until age 75. You may not need to do this test if you get a colonoscopy every 10 years.  Flexible sigmoidoscopy or colonoscopy.** / Every 5 years for a flexible sigmoidoscopy or every 10 years for a colonoscopy beginning at age 50 and continuing until age 75.  Hepatitis C blood test.** / For all people born from 1945 through 1965 and any individual with known risks for hepatitis C.  Skin self-exam. / Monthly.  Influenza immunization.** / Every year.  Pneumococcal polysaccharide immunization.** / 1 to 2 doses if you smoke cigarettes or if you have certain chronic medical conditions.  Tetanus, diphtheria, pertussis (Tdap, Td) immunization.** / A one-time dose of Tdap vaccine. After that, you need a Td booster dose every 10 years.  Measles, mumps, rubella (MMR) immunization. / You need at least 1 dose of MMR if you were born in 1957 or later. You may also need a second dose.  Varicella immunization.** / Consult your caregiver.  Meningococcal immunization.** / Consult your caregiver.  Hepatitis A immunization.** / Consult your caregiver. 2 doses, 6 to 18 months apart.  Hepatitis B immunization.** / Consult your caregiver. 3 doses, usually over 6 months. Ages 65 and over  Blood pressure check.** / Every 1 to 2 years.  Lipid and cholesterol check.** / Every 5 years beginning at age 20.  Clinical breast exam.** / Every year after age 40.  Mammogram.** / Every year beginning at age 40 and continuing for as long as you are in good health. Consult with your caregiver.  Pap test.** / Every 3 years starting at age 30 through age 65 or 70 with a 3  consecutive normal Pap tests. Testing can be stopped between 65 and 70 with 3 consecutive normal Pap tests and no abnormal Pap or HPV tests in the past 10 years.  HPV screening.** / Every 3 years from ages 30 through ages 65 or 70 with a history of 3 consecutive normal Pap tests. Testing can be stopped between 65 and 70 with 3 consecutive normal Pap tests and no abnormal Pap or HPV tests in the past 10 years.  Fecal occult blood test (FOBT) of stool. / Every year beginning at age 50 and continuing until age 75. You may not need to do this test if you get a colonoscopy every 10 years.  Flexible sigmoidoscopy or colonoscopy.** / Every 5 years for a flexible sigmoidoscopy or every 10 years for a colonoscopy beginning at age 50 and continuing until age 75.  Hepatitis C blood test.** / For all people born from 1945 through 1965 and any individual with known risks for hepatitis C.  Osteoporosis screening.** / A one-time screening for women ages 65 and over and women at risk for fractures or osteoporosis.  Skin self-exam. / Monthly.  Influenza immunization.** / Every year.  Pneumococcal polysaccharide immunization.** / 1 dose at age 65 (or older) if you have never been vaccinated.  Tetanus, diphtheria, pertussis (Tdap, Td) immunization. / A one-time dose of Tdap vaccine if you are over   65 and have contact with an infant, are a healthcare worker, or simply want to be protected from whooping cough. After that, you need a Td booster dose every 10 years.  Varicella immunization.** / Consult your caregiver.  Meningococcal immunization.** / Consult your caregiver.  Hepatitis A immunization.** / Consult your caregiver. 2 doses, 6 to 18 months apart.  Hepatitis B immunization.** / Check with your caregiver. 3 doses, usually over 6 months. ** Family history and personal history of risk and conditions may change your caregiver's recommendations. Document Released: 03/11/2001 Document Revised: 04/07/2011  Document Reviewed: 06/10/2010 [employer] Patient Information 2014 [employer].  

## 2012-08-06 ENCOUNTER — Encounter: Payer: Self-pay | Admitting: Gastroenterology

## 2012-09-07 ENCOUNTER — Other Ambulatory Visit: Payer: Self-pay | Admitting: Obstetrics and Gynecology

## 2012-09-17 ENCOUNTER — Encounter: Payer: Self-pay | Admitting: Gastroenterology

## 2012-10-13 ENCOUNTER — Encounter: Payer: Self-pay | Admitting: Gastroenterology

## 2012-10-13 ENCOUNTER — Ambulatory Visit (INDEPENDENT_AMBULATORY_CARE_PROVIDER_SITE_OTHER): Payer: PRIVATE HEALTH INSURANCE | Admitting: Gastroenterology

## 2012-10-13 VITALS — BP 120/80 | HR 62 | Ht 65.5 in | Wt 145.2 lb

## 2012-10-13 DIAGNOSIS — Z1211 Encounter for screening for malignant neoplasm of colon: Secondary | ICD-10-CM

## 2012-10-13 DIAGNOSIS — R05 Cough: Secondary | ICD-10-CM

## 2012-10-13 MED ORDER — SOD PICOSULFATE-MAG OX-CIT ACD 10-3.5-12 MG-GM-GM PO PACK: 1.0000 | PACK | ORAL | Status: DC

## 2012-10-13 NOTE — Progress Notes (Signed)
History of Present Illness: This is a 52 year old female who has had a chronic cough without any typical reflux symptoms. She feels that allergies may contribute to her cough. She recently started taking ranitidine 150 mg daily and notes that her chronic cough has slightly improved. Her mother was recently diagnosed with gastric cancer and passed away. Denies weight loss, abdominal pain, constipation, diarrhea, change in stool caliber, melena, hematochezia, nausea, vomiting, dysphagia, reflux symptoms, chest pain.  Review of Systems: Pertinent positive and negative review of systems were noted in the above HPI section. All other review of systems were otherwise negative.  Current Medications, Allergies, Past Medical History, Past Surgical History, Family History and Social History were reviewed in [employer] record.  Physical Exam: General: Well developed , well nourished, no acute distress Head: Normocephalic and atraumatic Eyes:  sclerae anicteric, EOMI Ears: Normal auditory acuity Mouth: No deformity or lesions Neck: Supple, no masses or thyromegaly Lungs: Clear throughout to auscultation Heart: Regular rate and rhythm; no murmurs, rubs or bruits Abdomen: Soft, non tender and non distended. No masses, hepatosplenomegaly or hernias noted. Normal Bowel sounds Rectal: deferred to colonoscopy Musculoskeletal: Symmetrical with no gross deformities  Skin: No lesions on visible extremities Pulses:  Normal pulses noted Extremities: No clubbing, cyanosis, edema or deformities noted Neurological: Alert oriented x 4, grossly nonfocal Cervical Nodes:  No significant cervical adenopathy Inguinal Nodes: No significant inguinal adenopathy Psychological:  Alert and cooperative. Normal mood and affect  Assessment and Recommendations:  1. Colorectal cancer screening, average risk. The risks, benefits, and alternatives to colonoscopy with possible biopsy and possible polypectomy  were discussed with the patient and they consent to proceed.   2. Chronic cough. Possible GERD. Ranitidine 300 mg po daily and standard antireflux measures. Consider change to a PPI. Schedule EGD. The risks, benefits, and alternatives to endoscopy with possible biopsy and possible dilation were discussed with the patient and they consent to proceed.

## 2012-10-13 NOTE — Patient Instructions (Addendum)
You have been scheduled for an endoscopy and colonoscopy with propofol. Please follow the written instructions given to you at your visit today. Please pick up your prep at the pharmacy within the next 1-3 days. If you use inhalers (even only as needed), please bring them with you on the day of your procedure. Your physician has requested that you go to www.[email or web address].[email or web address] and enter the access code given to you at your visit today. This web site gives a general overview about your procedure. However, you should still follow specific instructions given to you by our office regarding your preparation for the procedure.  Thank you for choosing me and [facility].  Malcolm T. Stark, Jr., MD., FACG  

## 2012-10-18 ENCOUNTER — Encounter: Payer: Self-pay | Admitting: Gastroenterology

## 2012-11-12 ENCOUNTER — Encounter: Payer: Self-pay | Admitting: Gastroenterology

## 2012-11-12 ENCOUNTER — Ambulatory Visit (AMBULATORY_SURGERY_CENTER): Payer: PRIVATE HEALTH INSURANCE | Admitting: Gastroenterology

## 2012-11-12 VITALS — BP 143/78 | HR 49 | Temp 98.9°F | Resp 13 | Ht 65.5 in | Wt 145.0 lb

## 2012-11-12 DIAGNOSIS — D126 Benign neoplasm of colon, unspecified: Secondary | ICD-10-CM

## 2012-11-12 DIAGNOSIS — R05 Cough: Secondary | ICD-10-CM | POA: Insufficient documentation

## 2012-11-12 DIAGNOSIS — Z1211 Encounter for screening for malignant neoplasm of colon: Secondary | ICD-10-CM

## 2012-11-12 DIAGNOSIS — K2990 Gastroduodenitis, unspecified, without bleeding: Secondary | ICD-10-CM

## 2012-11-12 DIAGNOSIS — K2960 Other gastritis without bleeding: Secondary | ICD-10-CM

## 2012-11-12 DIAGNOSIS — K2970 Gastritis, unspecified, without bleeding: Secondary | ICD-10-CM

## 2012-11-12 DIAGNOSIS — Z0000 Encounter for general adult medical examination without abnormal findings: Secondary | ICD-10-CM | POA: Insufficient documentation

## 2012-11-12 MED ORDER — SODIUM CHLORIDE 0.9 % IV SOLN: 500.0000 mL | INTRAVENOUS | Status: DC

## 2012-11-12 NOTE — Progress Notes (Signed)
Procedure ends, to recovery, report given and VSS. 

## 2012-11-12 NOTE — Op Note (Signed)
[facility] [address] [place], [address]   COLONOSCOPY PROCEDURE REPORT  PATIENT: Shelly Mcgee, Shelly Mcgee  MR#: [id number] BIRTHDATE: [birth year] , 52  yrs. old GENDER: Female ENDOSCOPIST: Meryl Dare, MD, [facility] REFERRED XB:JYNWGN Lowne, DO PROCEDURE DATE:  11/12/2012 PROCEDURE:   Colonoscopy with biopsy First Screening Colonoscopy - Avg.  risk and is 50 yrs.  old or older Yes.  Prior Negative Screening - Now for repeat screening. N/A  History of Adenoma - Now for follow-up colonoscopy & has been > or = to 3 yrs.  N/A  Polyps Removed Today? Yes. ASA CLASS:   Class II INDICATIONS:average risk screening. MEDICATIONS: MAC sedation, administered by CRNA and propofol (Diprivan) 290mg  IV DESCRIPTION OF PROCEDURE:   After the risks benefits and alternatives of the procedure were thoroughly explained, informed consent was obtained.  A digital rectal exam revealed no abnormalities of the rectum.   The LB [id number]  endoscope was introduced through the anus and advanced to the cecum, which was identified by both the appendix and ileocecal valve. No adverse events experienced.   The quality of the prep was [place] good The instrument was then slowly withdrawn as the colon was fully examined.  COLON FINDINGS: A sessile polyp measuring 5 mm in size was found at the hepatic flexure.  A polypectomy was performed with cold forceps.  The resection was complete and the polyp tissue was completely retrieved.   The colon was otherwise normal.  There was no diverticulosis, inflammation, polyps or cancers unless previously stated.  Retroflexed views revealed small internal hemorrhoids. The time to cecum=3 minutes 21 seconds. Withdrawal time=11 minutes 32 seconds. The scope was withdrawn and the procedure completed. COMPLICATIONS: There were no complications.  ENDOSCOPIC IMPRESSION: 1.   Sessile polyp measuring 5 mm at the hepatic flexure; polypectomy performed with cold  forceps 2.   Small internal hemorrhoids  RECOMMENDATIONS: 1.  Await pathology results 2.  Repeat colonoscopy in 5 years if polyp adenomatous; otherwise 10 years  eSigned:  Meryl Dare, MD, [facility] 11/12/2012 9:31 AM

## 2012-11-12 NOTE — Progress Notes (Signed)
Patient did not experience any of the following events: a burn prior to discharge; a fall within the facility; wrong site/side/patient/procedure/implant event; or a hospital transfer or hospital admission upon discharge from the facility. (G8907) Patient did not have preoperative order for IV antibiotic SSI prophylaxis. (G8918)  

## 2012-11-12 NOTE — Op Note (Signed)
[facility] [address] [name], [address]   ENDOSCOPY PROCEDURE REPORT  PATIENT: Shelly Mcgee, Shelly Mcgee  MR#: [id number] BIRTHDATE: 1960/06/15 , 52  yrs. old GENDER: Female ENDOSCOPIST: Meryl Dare, MD, [facility] REFERRED BY:  Loreen Freud, DO PROCEDURE DATE:  11/12/2012 PROCEDURE:  EGD w/ biopsy ASA CLASS:     Class II INDICATIONS:  chronic cough. MEDICATIONS: There was residual sedation effect present from prior procedure and propofol (Diprivan) 150mg  IV TOPICAL ANESTHETIC: none DESCRIPTION OF PROCEDURE: After the risks benefits and alternatives of the procedure were thoroughly explained, informed consent was obtained.  The LB [id number] endoscope was introduced through the mouth and advanced to the second portion of the duodenum without limitations.  The instrument was slowly withdrawn as the mucosa was fully examined.   STOMACH: Mild gastritis, erythema, was found in the gastric body and gastric antrum. Multiple biopsies were performed.   The stomach otherwise appeared normal. ESOPHAGUS: The mucosa of the esophagus appeared normal.  Multiple random biopsies in the distal esophagus were performed. DUODENUM: The duodenal mucosa showed no abnormalities in the bulb and second portion of the duodenum.  Retroflexed views revealed no abnormalities.  The scope was then withdrawn from the patient and the procedure completed.  COMPLICATIONS: There were no complications. ENDOSCOPIC IMPRESSION: 1.   Gastritis in the gastric body and gastric antrum; multiple biopsies 2.   The esophagus appeared normal; multiple random biopsies  RECOMMENDATIONS: 1.  Await pathology results 2.  If evidence of GERD on esophageal biopsies change to PPI BID for 3 month trial   eSigned:  Meryl Dare, MD, [facility] 11/12/2012 9:43 AM

## 2012-11-12 NOTE — Progress Notes (Signed)
Called to room to assist during endoscopic procedure.  Patient ID and intended procedure confirmed with present staff. Received instructions for my participation in the procedure from the performing physician.  

## 2012-11-12 NOTE — Patient Instructions (Addendum)
Impressions/recommendations:   Colonoscpopy Polyp (handout given) Hemorrhoids (handout given)  Repeat colonoscopy pending pathology.   Endoscopy Gastritis (handout given)  Pending pathology results   YOU HAD AN ENDOSCOPIC PROCEDURE TODAY AT THE [facility]: Refer to the procedure report that was given to you for any specific questions about what was found during the examination.  If the procedure report does not answer your questions, please call your gastroenterologist to clarify.  If you requested that your care partner not be given the details of your procedure findings, then the procedure report has been included in a sealed envelope for you to review at your convenience later.  YOU SHOULD EXPECT: Some feelings of bloating in the abdomen. Passage of more gas than usual.  Walking can help get rid of the air that was put into your GI tract during the procedure and reduce the bloating. If you had a lower endoscopy (such as a colonoscopy or flexible sigmoidoscopy) you may notice spotting of blood in your stool or on the toilet paper. If you underwent a bowel prep for your procedure, then you may not have a normal bowel movement for a few days.  DIET: Your first meal following the procedure should be a light meal and then it is ok to progress to your normal diet.  A half-sandwich or bowl of soup is an example of a good first meal.  Heavy or fried foods are harder to digest and may make you feel nauseous or bloated.  Likewise meals heavy in dairy and vegetables can cause extra gas to form and this can also increase the bloating.  Drink plenty of fluids but you should avoid alcoholic beverages for 24 hours.  ACTIVITY: Your care partner should take you home directly after the procedure.  You should plan to take it easy, moving slowly for the rest of the day.  You can resume normal activity the day after the procedure however you should NOT DRIVE or use heavy machinery for 24 hours  (because of the sedation medicines used during the test).    SYMPTOMS TO REPORT IMMEDIATELY: A gastroenterologist can be reached at any hour.  During normal business hours, 8:30 AM to 5:00 PM Monday through Friday, call [phone].  After hours and on weekends, please call the GI answering service at [phone] who will take a message and have the physician on call contact you.   Following lower endoscopy (colonoscopy or flexible sigmoidoscopy):  Excessive amounts of blood in the stool  Significant tenderness or worsening of abdominal pains  Swelling of the abdomen that is new, acute  Fever of 100F or higher  Following upper endoscopy (EGD)  Vomiting of blood or coffee ground material  New chest pain or pain under the shoulder blades  Painful or persistently difficult swallowing  New shortness of breath  Fever of 100F or higher  Black, tarry-looking stools  FOLLOW UP: If any biopsies were taken you will be contacted by phone or by letter within the next 1-3 weeks.  Call your gastroenterologist if you have not heard about the biopsies in 3 weeks.  Our staff will call the home number listed on your records the next business day following your procedure to check on you and address any questions or concerns that you may have at that time regarding the information given to you following your procedure. This is a courtesy call and so if there is no answer at the home number and we have not heard from  you through the emergency physician on call, we will assume that you have returned to your regular daily activities without incident.  SIGNATURES/CONFIDENTIALITY: You and/or your care partner have signed paperwork which will be entered into your electronic medical record.  These signatures attest to the fact that that the information above on your After Visit Summary has been reviewed and is understood.  Full responsibility of the confidentiality of this discharge information lies with you  and/or your care-partner.

## 2012-11-15 ENCOUNTER — Telehealth: Payer: Self-pay | Admitting: *Deleted

## 2012-11-15 NOTE — Telephone Encounter (Signed)
  Follow up Call-  Call back number 11/12/2012  Post procedure Call Back phone  # [phone]  Permission to leave phone message Yes     Patient questions:  Do you have a fever, pain , or abdominal swelling? no Pain Score  0 *  Have you tolerated food without any problems? yes  Have you been able to return to your normal activities? yes  Do you have any questions about your discharge instructions: Diet   no Medications  no Follow up visit  no  Do you have questions or concerns about your Care? no  Actions: * If pain score is 4 or above: No action needed, pain <4.

## 2012-11-16 ENCOUNTER — Encounter: Payer: Self-pay | Admitting: Gastroenterology

## 2013-03-23 ENCOUNTER — Telehealth: Payer: Self-pay | Admitting: Gastroenterology

## 2013-03-23 DIAGNOSIS — J302 Other seasonal allergic rhinitis: Secondary | ICD-10-CM

## 2013-03-23 DIAGNOSIS — R059 Cough, unspecified: Secondary | ICD-10-CM

## 2013-03-23 DIAGNOSIS — Z0000 Encounter for general adult medical examination without abnormal findings: Secondary | ICD-10-CM

## 2013-03-23 DIAGNOSIS — R05 Cough: Secondary | ICD-10-CM

## 2013-03-23 MED ORDER — RANITIDINE HCL 150 MG PO TABS: 150.0000 mg | ORAL_TABLET | Freq: Two times a day (BID) | ORAL | Status: DC

## 2013-03-23 NOTE — Telephone Encounter (Signed)
My EGD note from 10/2012 says a PPI bid however if she is controlled on ranitidine 150 mg po bid that is fine with me. If she is not controlled on ranitidine should have omeprazole 20 mg po bid. Both of these meds are OTC if she wants to get them that way or we can prescribe.

## 2013-03-23 NOTE — Telephone Encounter (Signed)
Patient states she has been taking her husband's Zantac BID. States Dr. Fuller Plan knew this. She is asking for her on rx for Zantac 150 mg BID. Is this ok?

## 2013-03-23 NOTE — Telephone Encounter (Signed)
rx sent to pharmacy for Zantac 150 mg BID as per patient request. Left a message for patient that rx has been sent.

## 2013-03-28 ENCOUNTER — Telehealth: Payer: Self-pay | Admitting: Gastroenterology

## 2013-03-28 DIAGNOSIS — R05 Cough: Secondary | ICD-10-CM

## 2013-03-28 DIAGNOSIS — Z0000 Encounter for general adult medical examination without abnormal findings: Secondary | ICD-10-CM

## 2013-03-28 DIAGNOSIS — R059 Cough, unspecified: Secondary | ICD-10-CM

## 2013-03-28 DIAGNOSIS — J302 Other seasonal allergic rhinitis: Secondary | ICD-10-CM

## 2013-03-28 MED ORDER — RANITIDINE HCL 150 MG PO TABS: 150.0000 mg | ORAL_TABLET | Freq: Two times a day (BID) | ORAL | Status: DC

## 2013-03-28 NOTE — Telephone Encounter (Signed)
Prescription re-sent to pharmacy.

## 2013-08-13 ENCOUNTER — Other Ambulatory Visit: Payer: Self-pay | Admitting: Gastroenterology

## 2013-08-17 ENCOUNTER — Telehealth: Payer: Self-pay | Admitting: Gastroenterology

## 2013-08-17 DIAGNOSIS — Z0000 Encounter for general adult medical examination without abnormal findings: Secondary | ICD-10-CM

## 2013-08-17 DIAGNOSIS — J302 Other seasonal allergic rhinitis: Secondary | ICD-10-CM

## 2013-08-17 DIAGNOSIS — R059 Cough, unspecified: Secondary | ICD-10-CM

## 2013-08-17 DIAGNOSIS — R05 Cough: Secondary | ICD-10-CM

## 2013-08-18 MED ORDER — RANITIDINE HCL 150 MG PO TABS: 150.0000 mg | ORAL_TABLET | Freq: Two times a day (BID) | ORAL | Status: DC

## 2013-08-18 NOTE — Telephone Encounter (Signed)
Left a message for patient to return my call. 

## 2013-08-18 NOTE — Telephone Encounter (Signed)
Patient states it has not been in a year yet since last appt with [place] and is wondering if she can have refills. Told patient I can send refills until then but does have to seen every year for prescription refills. Patient states she has an upcoming appt wither her PCP and can get her PCP to take over refills for ranitidine. Told patient that was fine and can schedule with [place] as needed.

## 2013-09-14 ENCOUNTER — Ambulatory Visit (INDEPENDENT_AMBULATORY_CARE_PROVIDER_SITE_OTHER): Payer: PRIVATE HEALTH INSURANCE | Admitting: Family Medicine

## 2013-09-14 VITALS — BP 122/76 | HR 65 | Temp 98.2°F | Resp 17 | Ht 66.0 in | Wt 147.0 lb

## 2013-09-14 DIAGNOSIS — H01113 Allergic dermatitis of right eye, unspecified eyelid: Secondary | ICD-10-CM

## 2013-09-14 DIAGNOSIS — H01116 Allergic dermatitis of left eye, unspecified eyelid: Secondary | ICD-10-CM

## 2013-09-14 DIAGNOSIS — L309 Dermatitis, unspecified: Secondary | ICD-10-CM

## 2013-09-14 DIAGNOSIS — H01119 Allergic dermatitis of unspecified eye, unspecified eyelid: Secondary | ICD-10-CM

## 2013-09-14 DIAGNOSIS — L259 Unspecified contact dermatitis, unspecified cause: Secondary | ICD-10-CM

## 2013-09-14 MED ORDER — PREDNISONE 20 MG PO TABS: ORAL_TABLET | ORAL | Status: DC

## 2013-09-14 NOTE — Progress Notes (Signed)
Subjective: For the past week or 10 days the patient has been puffy her upper eyelids, worse the last few days. She tried a little small amount of topical hydrocortisone cream, along with a couple of other things, without success. She changed her makeup without success. She knows of no allergies or contact. She had poison ivy earlier this summer. No rashes elsewhere.  Objective: Very puffy and red upper eyelids bilaterally, left worse than right. Conjunctiva do not appear inflamed. Fundi appeared normal. Lower lids looks spared a little area of erythema about 1 inch in diameter left temple that is surrounding a little zit. There is also rash anterior to the left ear, about 2 x 4 cm.  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 I suspect it is some kind of contact allergen. Taper prednisone. Minimal use of hydrocortisone cream. Return if not improving.

## 2013-09-14 NOTE — Patient Instructions (Signed)
Use hydrocortisone cream tiny amount daily for 4-5 days then stop use.  Take prednisone 20 3 daily for 2 days, the 2 daily for 2 days, then 1 daily for 2 days  Take zyrtec once or twice daily for 2 days, then decrease to daily  Return if worse

## 2013-09-15 ENCOUNTER — Ambulatory Visit (INDEPENDENT_AMBULATORY_CARE_PROVIDER_SITE_OTHER): Payer: PRIVATE HEALTH INSURANCE | Admitting: Family Medicine

## 2013-09-15 ENCOUNTER — Other Ambulatory Visit (HOSPITAL_COMMUNITY)
Admission: RE | Admit: 2013-09-15 | Discharge: 2013-09-15 | Disposition: A | Discharge status: Home / Self Care | Payer: PRIVATE HEALTH INSURANCE | Source: Ambulatory Visit | Attending: Family Medicine | Admitting: Family Medicine

## 2013-09-15 ENCOUNTER — Encounter: Payer: Self-pay | Admitting: Family Medicine

## 2013-09-15 VITALS — BP 120/80 | HR 65 | Temp 98.0°F | Ht 65.75 in | Wt 145.0 lb

## 2013-09-15 DIAGNOSIS — Z0000 Encounter for general adult medical examination without abnormal findings: Secondary | ICD-10-CM

## 2013-09-15 DIAGNOSIS — R8290 Unspecified abnormal findings in urine: Secondary | ICD-10-CM

## 2013-09-15 DIAGNOSIS — J309 Allergic rhinitis, unspecified: Secondary | ICD-10-CM

## 2013-09-15 DIAGNOSIS — Z01419 Encounter for gynecological examination (general) (routine) without abnormal findings: Secondary | ICD-10-CM | POA: Insufficient documentation

## 2013-09-15 DIAGNOSIS — R05 Cough: Secondary | ICD-10-CM

## 2013-09-15 DIAGNOSIS — Z1151 Encounter for screening for human papillomavirus (HPV): Secondary | ICD-10-CM | POA: Insufficient documentation

## 2013-09-15 DIAGNOSIS — R059 Cough, unspecified: Secondary | ICD-10-CM

## 2013-09-15 DIAGNOSIS — R82998 Other abnormal findings in urine: Secondary | ICD-10-CM

## 2013-09-15 DIAGNOSIS — Z124 Encounter for screening for malignant neoplasm of cervix: Secondary | ICD-10-CM

## 2013-09-15 DIAGNOSIS — J302 Other seasonal allergic rhinitis: Secondary | ICD-10-CM

## 2013-09-15 LAB — POCT URINALYSIS DIPSTICK
BILIRUBIN UA: NEGATIVE
GLUCOSE UA: NEGATIVE
KETONES UA: NEGATIVE
Leukocytes, UA: NEGATIVE
Nitrite, UA: NEGATIVE
Protein, UA: NEGATIVE
SPEC GRAV UA: 1.02
Urobilinogen, UA: 0.2
pH, UA: 5

## 2013-09-15 LAB — BASIC METABOLIC PANEL
BUN: 16 mg/dL (ref 6–23)
CHLORIDE: 105 meq/L (ref 96–112)
CO2: 28 meq/L (ref 19–32)
CREATININE: 0.7 mg/dL (ref 0.4–1.2)
Calcium: 9.5 mg/dL (ref 8.4–10.5)
GFR: 91.9 mL/min (ref >60.00)
Glucose, Bld: 110 mg/dL — ABNORMAL HIGH (ref 70–99)
POTASSIUM: 4.6 meq/L (ref 3.5–5.1)
SODIUM: 139 meq/L (ref 135–145)

## 2013-09-15 LAB — CBC WITH DIFFERENTIAL/PLATELET
BASOS PCT: 0.5 % (ref 0.0–3.0)
Basophils Absolute: 0 10*3/uL (ref 0.0–0.1)
EOS ABS: 0 10*3/uL (ref 0.0–0.7)
Eosinophils Relative: 0.1 % (ref 0.0–5.0)
HCT: 40.2 % (ref 36.0–46.0)
Hemoglobin: 13.5 g/dL (ref 12.0–15.0)
LYMPHS PCT: 12.6 % (ref 12.0–46.0)
Lymphs Abs: 0.6 10*3/uL — ABNORMAL LOW (ref 0.7–4.0)
MCHC: 33.6 g/dL (ref 30.0–36.0)
MCV: 91.8 fl (ref 78.0–100.0)
MONOS PCT: 2.4 % — AB (ref 3.0–12.0)
Monocytes Absolute: 0.1 10*3/uL (ref 0.1–1.0)
NEUTROS PCT: 84.4 % — AB (ref 43.0–77.0)
Neutro Abs: 3.9 10*3/uL (ref 1.4–7.7)
Platelets: 184 10*3/uL (ref 150.0–400.0)
RBC: 4.38 Mil/uL (ref 3.87–5.11)
RDW: 12.4 % (ref 11.5–15.5)
WBC: 4.6 10*3/uL (ref 4.0–10.5)

## 2013-09-15 LAB — LIPID PANEL
CHOL/HDL RATIO: 3
Cholesterol: 180 mg/dL (ref 0–200)
HDL: 70.5 mg/dL (ref >39.00)
LDL CALC: 96 mg/dL (ref 0–99)
NONHDL: 109.5
Triglycerides: 70 mg/dL (ref 0.0–149.0)
VLDL: 14 mg/dL (ref 0.0–40.0)

## 2013-09-15 LAB — HEPATIC FUNCTION PANEL
ALK PHOS: 45 U/L (ref 39–117)
ALT: 14 U/L (ref 0–35)
AST: 18 U/L (ref 0–37)
Albumin: 4.3 g/dL (ref 3.5–5.2)
BILIRUBIN DIRECT: 0 mg/dL (ref 0.0–0.3)
BILIRUBIN TOTAL: 0.6 mg/dL (ref 0.2–1.2)
Total Protein: 7.4 g/dL (ref 6.0–8.3)

## 2013-09-15 LAB — TSH: TSH: 0.7 u[IU]/mL (ref 0.35–4.50)

## 2013-09-15 MED ORDER — RANITIDINE HCL 150 MG PO TABS: 150.0000 mg | ORAL_TABLET | Freq: Two times a day (BID) | ORAL | Status: DC

## 2013-09-15 MED ORDER — AZELASTINE HCL 0.05 % OP SOLN: 2.0000 [drp] | Freq: Two times a day (BID) | OPHTHALMIC | Status: DC

## 2013-09-15 NOTE — Progress Notes (Signed)
Subjective:     Shelly Mcgee is a 53 y.o. female and is here for a comprehensive physical exam. The patient reports pt was in UC yesterday for eye swelling-- see UC ov.  History   Social History  . Marital Status: Married    Spouse Name: N/A    Number of Children: N/A  . Years of Education: N/A   Occupational History  . [employer]--[address]    Social History Main Topics  . Smoking status: Former Smoker -- 0.50 packs/day for 6 years    Quit date: 08/04/1982  . Smokeless tobacco: Never Used  . Alcohol Use: 3.0 oz/week    6 drink(s) per week  . Drug Use: No  . Sexual Activity: Yes    Partners: Male    Birth Control/ Protection: Surgical     Comment: had ablation   Other Topics Concern  . Not on file   Social History Narrative   Exercise-- yoga 3x aweek ,  Walks 10 miles a week   Health Maintenance  Topic Date Due  . Mammogram  10/03/2011  . Pap Smear  08/03/2013  . Influenza Vaccine  11/15/2013 (Originally 08/27/2013)  . Colonoscopy  11/12/2017  . Tetanus/tdap  06/03/2020    The following portions of the patient's history were reviewed and updated as appropriate:  She  has a past medical history of Endometriosis; Anxiety; Cervical dysplasia; and Allergy. She  does not have any pertinent problems on file. She  has past surgical history that includes Cesarean section; Pelvic laparoscopy; Laser ablation of the cervix; Dilation and curettage of uterus; Endometrial ablation; Gynecologic cryosurgery; and Colposcopy (1994). Her family history includes Diabetes in her brother, father, maternal aunt, maternal grandmother, maternal uncle, and mother; Hypertension in her mother; Personality disorder in her sister; Stomach cancer in her mother; Thyroid disease in her paternal grandmother and sister. There is no history of Colon cancer, Esophageal cancer, or Rectal cancer. She  reports that she quit smoking about 31 years ago. She has never used smokeless tobacco. She reports that  she drinks about 3 ounces of alcohol per week. She reports that she does not use illicit drugs. She has a current medication list which includes the following prescription(s): glucosamine-chondroitin, ibuprofen, prednisone, ranitidine, sodium chloride-sodium bicarb, and azelastine. Current Outpatient Prescriptions on File Prior to Visit  Medication Sig Dispense Refill  . glucosamine-chondroitin 500-400 MG tablet Take 1 tablet by mouth daily.      . IBUPROFEN PO Take by mouth.      . predniSONE (DELTASONE) 20 MG tablet Take 3 pills daily for 2 days, then 2 daily for 2 days, then one daily for 2 days for allergy  12 tablet  0  . Sodium Chloride-Sodium Bicarb (AYR SALINE NASAL RINSE NA) Place into the nose as needed.       No current facility-administered medications on file prior to visit.   She is allergic to wasp venom and yellow jacket venom..  Review of Systems Review of Systems  Constitutional: Negative for activity change, appetite change and fatigue.  HENT: Negative for hearing loss, congestion, tinnitus and ear discharge.  dentist q92m Eyes: Negative for visual disturbance (see optho q1y -- vision corrected to 20/20 with glasses).  Respiratory: Negative for cough, chest tightness and shortness of breath.   Cardiovascular: Negative for chest pain, palpitations and leg swelling.  Gastrointestinal: Negative for abdominal pain, diarrhea, constipation and abdominal distention.  Genitourinary: Negative for urgency, frequency, decreased urine volume and difficulty urinating.  Musculoskeletal: Negative for back pain, arthralgias and gait problem.  Skin: Negative for color change, pallor and rash.  Neurological: Negative for dizziness, light-headedness, numbness and headaches.  Hematological: Negative for adenopathy. Does not bruise/bleed easily.  Psychiatric/Behavioral: Negative for suicidal ideas, confusion, sleep disturbance, self-injury, dysphoric mood, decreased concentration and  agitation.       Objective:    BP 120/80  Pulse 65  Temp(Src) 98 F (36.7 C) (Oral)  Ht 5' 5.75" (1.67 m)  Wt 145 lb (65.772 kg)  BMI 23.58 kg/m2  SpO2 97%  LMP 09/11/2013 General appearance: alert, cooperative, appears stated age and no distress Head: Normocephalic, without obvious abnormality, atraumatic Eyes: conjunctivae/corneas clear. PERRL, EOM's intact. Fundi benign. Ears: normal TM's and external ear canals both ears Nose: Nares normal. Septum midline. Mucosa normal. No drainage or sinus tenderness. Throat: lips, mucosa, and tongue normal; teeth and gums normal Neck: no adenopathy, no carotid bruit, no JVD, supple, symmetrical, trachea midline and thyroid not enlarged, symmetric, no tenderness/mass/nodules Back: symmetric, no curvature. ROM normal. No CVA tenderness. Lungs: clear to auscultation bilaterally Breasts: normal appearance, no masses or tenderness Heart: regular rate and rhythm, S1, S2 normal, no murmur, click, rub or gallop Abdomen: soft, non-tender; bowel sounds normal; no masses,  no organomegaly Pelvic: cervix normal in appearance, external genitalia normal, no adnexal masses or tenderness, no cervical motion tenderness, rectovaginal septum normal, uterus normal size, shape, and consistency, vagina normal without discharge and pap done Extremities: extremities normal, atraumatic, no cyanosis or edema Pulses: 2+ and symmetric Skin: Skin color, texture, turgor normal. No rashes or lesions Lymph nodes: Cervical, supraclavicular, and axillary nodes normal. Neurologic: Alert and oriented X 3, normal strength and tone. Normal symmetric reflexes. Normal coordination and gait    Assessment:    Healthy female exam.      Plan:    ghm utd Check labs See After Visit Summary for Counseling Recommendations   1. Preventative health care   - ranitidine (ZANTAC) 150 MG tablet; Take 1 tablet (150 mg total) by mouth 2 (two) times daily.  Dispense: 60 tablet;  Refill: 2 - Basic metabolic panel - Hepatic function panel - Lipid panel - CBC with Differential - POCT urinalysis dipstick - TSH  2. Cough   - ranitidine (ZANTAC) 150 MG tablet; Take 1 tablet (150 mg total) by mouth 2 (two) times daily.  Dispense: 60 tablet; Refill: 2  3. Seasonal allergies   - ranitidine (ZANTAC) 150 MG tablet; Take 1 tablet (150 mg total) by mouth 2 (two) times daily.  Dispense: 60 tablet; Refill: 2 - azelastine (OPTIVAR) 0.05 % ophthalmic solution; Place 2 drops into both eyes 2 (two) times daily.  Dispense: 6 mL; Refill: 12

## 2013-09-15 NOTE — Addendum Note (Signed)
Addended by: Ewing Schlein on: 09/15/2013 03:54 PM   Modules accepted: Orders

## 2013-09-15 NOTE — Addendum Note (Signed)
Addended by: Peggyann Shoals on: 09/15/2013 02:25 PM   Modules accepted: Orders

## 2013-09-15 NOTE — Progress Notes (Signed)
Pre visit review using our clinic review tool, if applicable. No additional management support is needed unless otherwise documented below in the visit note. 

## 2013-09-15 NOTE — Patient Instructions (Signed)
Preventive Care for Adults A healthy lifestyle and preventive care can promote health and wellness. Preventive health guidelines for women include the following key practices.  A routine yearly physical is a good way to check with your health care provider about your health and preventive screening. It is a chance to share any concerns and updates on your health and to receive a thorough exam.  Visit your dentist for a routine exam and preventive care every 6 months. Brush your teeth twice a day and floss once a day. Good oral hygiene prevents tooth decay and gum disease.  The frequency of eye exams is based on your age, health, family medical history, use of contact lenses, and other factors. Follow your health care provider's recommendations for frequency of eye exams.  Eat a healthy diet. Foods like vegetables, fruits, whole grains, low-fat dairy products, and lean protein foods contain the nutrients you need without too many calories. Decrease your intake of foods high in solid fats, added sugars, and salt. Eat the right amount of calories for you.Get information about a proper diet from your health care provider, if necessary.  Regular physical exercise is one of the most important things you can do for your health. Most adults should get at least 150 minutes of moderate-intensity exercise (any activity that increases your heart rate and causes you to sweat) each week. In addition, most adults need muscle-strengthening exercises on 2 or more days a week.  Maintain a healthy weight. The body mass index (BMI) is a screening tool to identify possible weight problems. It provides an estimate of body fat based on height and weight. Your health care provider can find your BMI and can help you achieve or maintain a healthy weight.For adults 20 years and older:  A BMI below 18.5 is considered underweight.  A BMI of 18.5 to 24.9 is normal.  A BMI of 25 to 29.9 is considered overweight.  A BMI of  30 and above is considered obese.  Maintain normal blood lipids and cholesterol levels by exercising and minimizing your intake of saturated fat. Eat a balanced diet with plenty of fruit and vegetables. Blood tests for lipids and cholesterol should begin at age 76 and be repeated every 5 years. If your lipid or cholesterol levels are high, you are over 50, or you are at high risk for heart disease, you may need your cholesterol levels checked more frequently.Ongoing high lipid and cholesterol levels should be treated with medicines if diet and exercise are not working.  If you smoke, find out from your health care provider how to quit. If you do not use tobacco, do not start.  Lung cancer screening is recommended for adults aged 22-80 years who are at high risk for developing lung cancer because of a history of smoking. A yearly low-dose CT scan of the lungs is recommended for people who have at least a 30-pack-year history of smoking and are a current smoker or have quit within the past 15 years. A pack year of smoking is smoking an average of 1 pack of cigarettes a day for 1 year (for example: 1 pack a day for 30 years or 2 packs a day for 15 years). Yearly screening should continue until the smoker has stopped smoking for at least 15 years. Yearly screening should be stopped for people who develop a health problem that would prevent them from having lung cancer treatment.  If you are pregnant, do not drink alcohol. If you are breastfeeding,  be very cautious about drinking alcohol. If you are not pregnant and choose to drink alcohol, do not have more than 1 drink per day. One drink is considered to be 12 ounces (355 mL) of beer, 5 ounces (148 mL) of wine, or 1.5 ounces (44 mL) of liquor.  Avoid use of street drugs. Do not share needles with anyone. Ask for help if you need support or instructions about stopping the use of drugs.  High blood pressure causes heart disease and increases the risk of  stroke. Your blood pressure should be checked at least every 1 to 2 years. Ongoing high blood pressure should be treated with medicines if weight loss and exercise do not work.  If you are 75-52 years old, ask your health care provider if you should take aspirin to prevent strokes.  Diabetes screening involves taking a blood sample to check your fasting blood sugar level. This should be done once every 3 years, after age 15, if you are within normal weight and without risk factors for diabetes. Testing should be considered at a younger age or be carried out more frequently if you are overweight and have at least 1 risk factor for diabetes.  Breast cancer screening is essential preventive care for women. You should practice "breast self-awareness." This means understanding the normal appearance and feel of your breasts and may include breast self-examination. Any changes detected, no matter how small, should be reported to a health care provider. Women in their 58s and 30s should have a clinical breast exam (CBE) by a health care provider as part of a regular health exam every 1 to 3 years. After age 16, women should have a CBE every year. Starting at age 53, women should consider having a mammogram (breast X-ray test) every year. Women who have a family history of breast cancer should talk to their health care provider about genetic screening. Women at a high risk of breast cancer should talk to their health care providers about having an MRI and a mammogram every year.  Breast cancer gene (BRCA)-related cancer risk assessment is recommended for women who have family members with BRCA-related cancers. BRCA-related cancers include breast, ovarian, tubal, and peritoneal cancers. Having family members with these cancers may be associated with an increased risk for harmful changes (mutations) in the breast cancer genes BRCA1 and BRCA2. Results of the assessment will determine the need for genetic counseling and  BRCA1 and BRCA2 testing.  Routine pelvic exams to screen for cancer are no longer recommended for nonpregnant women who are considered low risk for cancer of the pelvic organs (ovaries, uterus, and vagina) and who do not have symptoms. Ask your health care provider if a screening pelvic exam is right for you.  If you have had past treatment for cervical cancer or a condition that could lead to cancer, you need Pap tests and screening for cancer for at least 20 years after your treatment. If Pap tests have been discontinued, your risk factors (such as having a new sexual partner) need to be reassessed to determine if screening should be resumed. Some women have medical problems that increase the chance of getting cervical cancer. In these cases, your health care provider may recommend more frequent screening and Pap tests.  The HPV test is an additional test that may be used for cervical cancer screening. The HPV test looks for the virus that can cause the cell changes on the cervix. The cells collected during the Pap test can be  tested for HPV. The HPV test could be used to screen women aged 30 years and older, and should be used in women of any age who have unclear Pap test results. After the age of 30, women should have HPV testing at the same frequency as a Pap test.  Colorectal cancer can be detected and often prevented. Most routine colorectal cancer screening begins at the age of 50 years and continues through age 75 years. However, your health care provider may recommend screening at an earlier age if you have risk factors for colon cancer. On a yearly basis, your health care provider may provide home test kits to check for hidden blood in the stool. Use of a small camera at the end of a tube, to directly examine the colon (sigmoidoscopy or colonoscopy), can detect the earliest forms of colorectal cancer. Talk to your health care provider about this at age 50, when routine screening begins. Direct  exam of the colon should be repeated every 5-10 years through age 75 years, unless early forms of pre-cancerous polyps or small growths are found.  People who are at an increased risk for hepatitis B should be screened for this virus. You are considered at high risk for hepatitis B if:  You were born in a country where hepatitis B occurs often. Talk with your health care provider about which countries are considered high risk.  Your parents were born in a high-risk country and you have not received a shot to protect against hepatitis B (hepatitis B vaccine).  You have HIV or AIDS.  You use needles to inject street drugs.  You live with, or have sex with, someone who has hepatitis B.  You get hemodialysis treatment.  You take certain medicines for conditions like cancer, organ transplantation, and autoimmune conditions.  Hepatitis C blood testing is recommended for all people born from 1945 through 1965 and any individual with known risks for hepatitis C.  Practice safe sex. Use condoms and avoid high-risk sexual practices to reduce the spread of sexually transmitted infections (STIs). STIs include gonorrhea, chlamydia, syphilis, trichomonas, herpes, HPV, and human immunodeficiency virus (HIV). Herpes, HIV, and HPV are viral illnesses that have no cure. They can result in disability, cancer, and death.  You should be screened for sexually transmitted illnesses (STIs) including gonorrhea and chlamydia if:  You are sexually active and are younger than 24 years.  You are older than 24 years and your health care provider tells you that you are at risk for this type of infection.  Your sexual activity has changed since you were last screened and you are at an increased risk for chlamydia or gonorrhea. Ask your health care provider if you are at risk.  If you are at risk of being infected with HIV, it is recommended that you take a prescription medicine daily to prevent HIV infection. This is  called preexposure prophylaxis (PrEP). You are considered at risk if:  You are a heterosexual woman, are sexually active, and are at increased risk for HIV infection.  You take drugs by injection.  You are sexually active with a partner who has HIV.  Talk with your health care provider about whether you are at high risk of being infected with HIV. If you choose to begin PrEP, you should first be tested for HIV. You should then be tested every 3 months for as long as you are taking PrEP.  Osteoporosis is a disease in which the bones lose minerals and strength   with aging. This can result in serious bone fractures or breaks. The risk of osteoporosis can be identified using a bone density scan. Women ages 65 years and over and women at risk for fractures or osteoporosis should discuss screening with their health care providers. Ask your health care provider whether you should take a calcium supplement or vitamin D to reduce the rate of osteoporosis.  Menopause can be associated with physical symptoms and risks. Hormone replacement therapy is available to decrease symptoms and risks. You should talk to your health care provider about whether hormone replacement therapy is right for you.  Use sunscreen. Apply sunscreen liberally and repeatedly throughout the day. You should seek shade when your shadow is shorter than you. Protect yourself by wearing long sleeves, pants, a wide-brimmed hat, and sunglasses year round, whenever you are outdoors.  Once a month, do a whole body skin exam, using a mirror to look at the skin on your back. Tell your health care provider of new moles, moles that have irregular borders, moles that are larger than a pencil eraser, or moles that have changed in shape or color.  Stay current with required vaccines (immunizations).  Influenza vaccine. All adults should be immunized every year.  Tetanus, diphtheria, and acellular pertussis (Td, Tdap) vaccine. Pregnant women should  receive 1 dose of Tdap vaccine during each pregnancy. The dose should be obtained regardless of the length of time since the last dose. Immunization is preferred during the 27th-36th week of gestation. An adult who has not previously received Tdap or who does not know her vaccine status should receive 1 dose of Tdap. This initial dose should be followed by tetanus and diphtheria toxoids (Td) booster doses every 10 years. Adults with an unknown or incomplete history of completing a 3-dose immunization series with Td-containing vaccines should begin or complete a primary immunization series including a Tdap dose. Adults should receive a Td booster every 10 years.  Varicella vaccine. An adult without evidence of immunity to varicella should receive 2 doses or a second dose if she has previously received 1 dose. Pregnant females who do not have evidence of immunity should receive the first dose after pregnancy. This first dose should be obtained before leaving the health care facility. The second dose should be obtained 4-8 weeks after the first dose.  Human papillomavirus (HPV) vaccine. Females aged 13-26 years who have not received the vaccine previously should obtain the 3-dose series. The vaccine is not recommended for use in pregnant females. However, pregnancy testing is not needed before receiving a dose. If a female is found to be pregnant after receiving a dose, no treatment is needed. In that case, the remaining doses should be delayed until after the pregnancy. Immunization is recommended for any person with an immunocompromised condition through the age of 26 years if she did not get any or all doses earlier. During the 3-dose series, the second dose should be obtained 4-8 weeks after the first dose. The third dose should be obtained 24 weeks after the first dose and 16 weeks after the second dose.  Zoster vaccine. One dose is recommended for adults aged 60 years or older unless certain conditions are  present.  Measles, mumps, and rubella (MMR) vaccine. Adults born before 1957 generally are considered immune to measles and mumps. Adults born in 1957 or later should have 1 or more doses of MMR vaccine unless there is a contraindication to the vaccine or there is laboratory evidence of immunity to   each of the three diseases. A routine second dose of MMR vaccine should be obtained at least 28 days after the first dose for students attending postsecondary schools, health care workers, or international travelers. People who received inactivated measles vaccine or an unknown type of measles vaccine during 1963-1967 should receive 2 doses of MMR vaccine. People who received inactivated mumps vaccine or an unknown type of mumps vaccine before 1979 and are at high risk for mumps infection should consider immunization with 2 doses of MMR vaccine. For females of childbearing age, rubella immunity should be determined. If there is no evidence of immunity, females who are not pregnant should be vaccinated. If there is no evidence of immunity, females who are pregnant should delay immunization until after pregnancy. Unvaccinated health care workers born before 1957 who lack laboratory evidence of measles, mumps, or rubella immunity or laboratory confirmation of disease should consider measles and mumps immunization with 2 doses of MMR vaccine or rubella immunization with 1 dose of MMR vaccine.  Pneumococcal 13-valent conjugate (PCV13) vaccine. When indicated, a person who is uncertain of her immunization history and has no record of immunization should receive the PCV13 vaccine. An adult aged 19 years or older who has certain medical conditions and has not been previously immunized should receive 1 dose of PCV13 vaccine. This PCV13 should be followed with a dose of pneumococcal polysaccharide (PPSV23) vaccine. The PPSV23 vaccine dose should be obtained at least 8 weeks after the dose of PCV13 vaccine. An adult aged 19  years or older who has certain medical conditions and previously received 1 or more doses of PPSV23 vaccine should receive 1 dose of PCV13. The PCV13 vaccine dose should be obtained 1 or more years after the last PPSV23 vaccine dose.  Pneumococcal polysaccharide (PPSV23) vaccine. When PCV13 is also indicated, PCV13 should be obtained first. All adults aged 65 years and older should be immunized. An adult younger than age 65 years who has certain medical conditions should be immunized. Any person who resides in a nursing home or long-term care facility should be immunized. An adult smoker should be immunized. People with an immunocompromised condition and certain other conditions should receive both PCV13 and PPSV23 vaccines. People with human immunodeficiency virus (HIV) infection should be immunized as soon as possible after diagnosis. Immunization during chemotherapy or radiation therapy should be avoided. Routine use of PPSV23 vaccine is not recommended for American Indians, [place] Natives, or people younger than 65 years unless there are medical conditions that require PPSV23 vaccine. When indicated, people who have unknown immunization and have no record of immunization should receive PPSV23 vaccine. One-time revaccination 5 years after the first dose of PPSV23 is recommended for people aged 19-64 years who have chronic kidney failure, nephrotic syndrome, asplenia, or immunocompromised conditions. People who received 1-2 doses of PPSV23 before age 65 years should receive another dose of PPSV23 vaccine at age 65 years or later if at least 5 years have passed since the previous dose. Doses of PPSV23 are not needed for people immunized with PPSV23 at or after age 65 years.  Meningococcal vaccine. Adults with asplenia or persistent complement component deficiencies should receive 2 doses of quadrivalent meningococcal conjugate (MenACWY-D) vaccine. The doses should be obtained at least 2 months apart.  Microbiologists working with certain meningococcal bacteria, military recruits, people at risk during an outbreak, and people who travel to or live in countries with a high rate of meningitis should be immunized. A first-year college student up through age   21 years who is living in a residence hall should receive a dose if she did not receive a dose on or after her 16th birthday. Adults who have certain high-risk conditions should receive one or more doses of vaccine.  Hepatitis A vaccine. Adults who wish to be protected from this disease, have certain high-risk conditions, work with hepatitis A-infected animals, work in hepatitis A research labs, or travel to or work in countries with a high rate of hepatitis A should be immunized. Adults who were previously unvaccinated and who anticipate close contact with an international adoptee during the first 60 days after arrival in the [place] from a country with a high rate of hepatitis A should be immunized.  Hepatitis B vaccine. Adults who wish to be protected from this disease, have certain high-risk conditions, may be exposed to blood or other infectious body fluids, are household contacts or sex partners of hepatitis B positive people, are clients or workers in certain care facilities, or travel to or work in countries with a high rate of hepatitis B should be immunized.  Haemophilus influenzae type b (Hib) vaccine. A previously unvaccinated person with asplenia or sickle cell disease or having a scheduled splenectomy should receive 1 dose of Hib vaccine. Regardless of previous immunization, a recipient of a hematopoietic stem cell transplant should receive a 3-dose series 6-12 months after her successful transplant. Hib vaccine is not recommended for adults with HIV infection. Preventive Services / Frequency Ages 64 to 68 years  Blood pressure check.** / Every 1 to 2 years.  Lipid and cholesterol check.** / Every 5 years beginning at age  22.  Clinical breast exam.** / Every 3 years for women in their 88s and 53s.  BRCA-related cancer risk assessment.** / For women who have family members with a BRCA-related cancer (breast, ovarian, tubal, or peritoneal cancers).  Pap test.** / Every 2 years from ages 90 through 51. Every 3 years starting at age 21 through age 56 or 3 with a history of 3 consecutive normal Pap tests.  HPV screening.** / Every 3 years from ages 24 through ages 1 to 46 with a history of 3 consecutive normal Pap tests.  Hepatitis C blood test.** / For any individual with known risks for hepatitis C.  Skin self-exam. / Monthly.  Influenza vaccine. / Every year.  Tetanus, diphtheria, and acellular pertussis (Tdap, Td) vaccine.** / Consult your health care provider. Pregnant women should receive 1 dose of Tdap vaccine during each pregnancy. 1 dose of Td every 10 years.  Varicella vaccine.** / Consult your health care provider. Pregnant females who do not have evidence of immunity should receive the first dose after pregnancy.  HPV vaccine. / 3 doses over 6 months, if 72 and younger. The vaccine is not recommended for use in pregnant females. However, pregnancy testing is not needed before receiving a dose.  Measles, mumps, rubella (MMR) vaccine.** / You need at least 1 dose of MMR if you were born in 1957 or later. You may also need a 2nd dose. For females of childbearing age, rubella immunity should be determined. If there is no evidence of immunity, females who are not pregnant should be vaccinated. If there is no evidence of immunity, females who are pregnant should delay immunization until after pregnancy.  Pneumococcal 13-valent conjugate (PCV13) vaccine.** / Consult your health care provider.  Pneumococcal polysaccharide (PPSV23) vaccine.** / 1 to 2 doses if you smoke cigarettes or if you have certain conditions.  Meningococcal vaccine.** /  1 dose if you are age 19 to 21 years and a first-year college  student living in a residence hall, or have one of several medical conditions, you need to get vaccinated against meningococcal disease. You may also need additional booster doses.  Hepatitis A vaccine.** / Consult your health care provider.  Hepatitis B vaccine.** / Consult your health care provider.  Haemophilus influenzae type b (Hib) vaccine.** / Consult your health care provider. Ages 40 to 64 years  Blood pressure check.** / Every 1 to 2 years.  Lipid and cholesterol check.** / Every 5 years beginning at age 20 years.  Lung cancer screening. / Every year if you are aged 55-80 years and have a 30-pack-year history of smoking and currently smoke or have quit within the past 15 years. Yearly screening is stopped once you have quit smoking for at least 15 years or develop a health problem that would prevent you from having lung cancer treatment.  Clinical breast exam.** / Every year after age 40 years.  BRCA-related cancer risk assessment.** / For women who have family members with a BRCA-related cancer (breast, ovarian, tubal, or peritoneal cancers).  Mammogram.** / Every year beginning at age 40 years and continuing for as long as you are in good health. Consult with your health care provider.  Pap test.** / Every 3 years starting at age 30 years through age 65 or 70 years with a history of 3 consecutive normal Pap tests.  HPV screening.** / Every 3 years from ages 30 years through ages 65 to 70 years with a history of 3 consecutive normal Pap tests.  Fecal occult blood test (FOBT) of stool. / Every year beginning at age 50 years and continuing until age 75 years. You may not need to do this test if you get a colonoscopy every 10 years.  Flexible sigmoidoscopy or colonoscopy.** / Every 5 years for a flexible sigmoidoscopy or every 10 years for a colonoscopy beginning at age 50 years and continuing until age 75 years.  Hepatitis C blood test.** / For all people born from 1945 through  1965 and any individual with known risks for hepatitis C.  Skin self-exam. / Monthly.  Influenza vaccine. / Every year.  Tetanus, diphtheria, and acellular pertussis (Tdap/Td) vaccine.** / Consult your health care provider. Pregnant women should receive 1 dose of Tdap vaccine during each pregnancy. 1 dose of Td every 10 years.  Varicella vaccine.** / Consult your health care provider. Pregnant females who do not have evidence of immunity should receive the first dose after pregnancy.  Zoster vaccine.** / 1 dose for adults aged 60 years or older.  Measles, mumps, rubella (MMR) vaccine.** / You need at least 1 dose of MMR if you were born in 1957 or later. You may also need a 2nd dose. For females of childbearing age, rubella immunity should be determined. If there is no evidence of immunity, females who are not pregnant should be vaccinated. If there is no evidence of immunity, females who are pregnant should delay immunization until after pregnancy.  Pneumococcal 13-valent conjugate (PCV13) vaccine.** / Consult your health care provider.  Pneumococcal polysaccharide (PPSV23) vaccine.** / 1 to 2 doses if you smoke cigarettes or if you have certain conditions.  Meningococcal vaccine.** / Consult your health care provider.  Hepatitis A vaccine.** / Consult your health care provider.  Hepatitis B vaccine.** / Consult your health care provider.  Haemophilus influenzae type b (Hib) vaccine.** / Consult your health care provider. Ages 65   years and over  Blood pressure check.** / Every 1 to 2 years.  Lipid and cholesterol check.** / Every 5 years beginning at age 22 years.  Lung cancer screening. / Every year if you are aged 73-80 years and have a 30-pack-year history of smoking and currently smoke or have quit within the past 15 years. Yearly screening is stopped once you have quit smoking for at least 15 years or develop a health problem that would prevent you from having lung cancer  treatment.  Clinical breast exam.** / Every year after age 4 years.  BRCA-related cancer risk assessment.** / For women who have family members with a BRCA-related cancer (breast, ovarian, tubal, or peritoneal cancers).  Mammogram.** / Every year beginning at age 40 years and continuing for as long as you are in good health. Consult with your health care provider.  Pap test.** / Every 3 years starting at age 9 years through age 34 or 91 years with 3 consecutive normal Pap tests. Testing can be stopped between 65 and 70 years with 3 consecutive normal Pap tests and no abnormal Pap or HPV tests in the past 10 years.  HPV screening.** / Every 3 years from ages 57 years through ages 64 or 45 years with a history of 3 consecutive normal Pap tests. Testing can be stopped between 65 and 70 years with 3 consecutive normal Pap tests and no abnormal Pap or HPV tests in the past 10 years.  Fecal occult blood test (FOBT) of stool. / Every year beginning at age 15 years and continuing until age 17 years. You may not need to do this test if you get a colonoscopy every 10 years.  Flexible sigmoidoscopy or colonoscopy.** / Every 5 years for a flexible sigmoidoscopy or every 10 years for a colonoscopy beginning at age 86 years and continuing until age 71 years.  Hepatitis C blood test.** / For all people born from [birth year] through 1964 and any individual with known risks for hepatitis C.  Osteoporosis screening.** / A one-time screening for women ages 83 years and over and women at risk for fractures or osteoporosis.  Skin self-exam. / Monthly.  Influenza vaccine. / Every year.  Tetanus, diphtheria, and acellular pertussis (Tdap/Td) vaccine.** / 1 dose of Td every 10 years.  Varicella vaccine.** / Consult your health care provider.  Zoster vaccine.** / 1 dose for adults aged 61 years or older.  Pneumococcal 13-valent conjugate (PCV13) vaccine.** / Consult your health care provider.  Pneumococcal  polysaccharide (PPSV23) vaccine.** / 1 dose for all adults aged 28 years and older.  Meningococcal vaccine.** / Consult your health care provider.  Hepatitis A vaccine.** / Consult your health care provider.  Hepatitis B vaccine.** / Consult your health care provider.  Haemophilus influenzae type b (Hib) vaccine.** / Consult your health care provider. ** Family history and personal history of risk and conditions may change your health care provider's recommendations. Document Released: 03/11/2001 Document Revised: 05/30/2013 Document Reviewed: 06/10/2010 [facility] Patient Information 2015 [place], [place]. This information is not intended to replace advice given to you by your health care provider. Make sure you discuss any questions you have with your health care provider.

## 2013-09-17 LAB — URINE CULTURE

## 2013-09-20 LAB — CYTOLOGY - PAP

## 2013-09-22 ENCOUNTER — Telehealth: Payer: Self-pay

## 2013-09-22 MED ORDER — EPINEPHRINE 0.3 MG/0.3ML IJ SOAJ: 0.3000 mg | Freq: Once | INTRAMUSCULAR | Status: DC

## 2013-09-22 NOTE — Telephone Encounter (Signed)
Shelly Mcgee [phone] Raye Sorrow called and she needs to get a new prescription for her Epepin, the one she has expired

## 2013-09-22 NOTE — Telephone Encounter (Signed)
Rx sent      KP 

## 2013-11-28 ENCOUNTER — Other Ambulatory Visit: Payer: Self-pay | Admitting: Family Medicine

## 2013-11-28 ENCOUNTER — Encounter: Payer: Self-pay | Admitting: Family Medicine

## 2014-06-27 ENCOUNTER — Other Ambulatory Visit: Payer: Self-pay | Admitting: Family Medicine

## 2014-06-27 DIAGNOSIS — Z1231 Encounter for screening mammogram for malignant neoplasm of breast: Secondary | ICD-10-CM

## 2014-06-28 ENCOUNTER — Ambulatory Visit (HOSPITAL_COMMUNITY)
Admission: RE | Admit: 2014-06-28 | Discharge: 2014-06-28 | Disposition: A | Discharge status: Home / Self Care | Payer: PRIVATE HEALTH INSURANCE | Source: Ambulatory Visit | Attending: Family Medicine | Admitting: Family Medicine

## 2014-06-28 DIAGNOSIS — Z1231 Encounter for screening mammogram for malignant neoplasm of breast: Secondary | ICD-10-CM | POA: Diagnosis present

## 2014-09-13 ENCOUNTER — Telehealth: Payer: Self-pay | Admitting: Family Medicine

## 2014-09-13 NOTE — Telephone Encounter (Signed)
Pre visit letter mailed 09/05/14

## 2014-09-20 ENCOUNTER — Encounter: Payer: PRIVATE HEALTH INSURANCE | Admitting: Family Medicine

## 2014-09-25 ENCOUNTER — Encounter: Payer: Self-pay | Admitting: *Deleted

## 2014-09-25 ENCOUNTER — Telehealth: Payer: Self-pay | Admitting: *Deleted

## 2014-09-25 NOTE — Telephone Encounter (Signed)
Pre-Visit Call completed with patient and chart updated.   Pre-Visit Info documented in Specialty Comments under [employer].    

## 2014-09-26 ENCOUNTER — Other Ambulatory Visit (HOSPITAL_COMMUNITY)
Admission: RE | Admit: 2014-09-26 | Discharge: 2014-09-26 | Disposition: A | Discharge status: Home / Self Care | Payer: PRIVATE HEALTH INSURANCE | Source: Ambulatory Visit | Attending: Family Medicine | Admitting: Family Medicine

## 2014-09-26 ENCOUNTER — Ambulatory Visit (INDEPENDENT_AMBULATORY_CARE_PROVIDER_SITE_OTHER): Payer: PRIVATE HEALTH INSURANCE | Admitting: Family Medicine

## 2014-09-26 ENCOUNTER — Encounter: Payer: Self-pay | Admitting: Family Medicine

## 2014-09-26 VITALS — BP 122/83 | HR 61 | Temp 98.3°F | Ht 66.0 in | Wt 145.8 lb

## 2014-09-26 DIAGNOSIS — Z01419 Encounter for gynecological examination (general) (routine) without abnormal findings: Secondary | ICD-10-CM | POA: Diagnosis present

## 2014-09-26 DIAGNOSIS — Z0000 Encounter for general adult medical examination without abnormal findings: Secondary | ICD-10-CM | POA: Diagnosis not present

## 2014-09-26 DIAGNOSIS — Z1151 Encounter for screening for human papillomavirus (HPV): Secondary | ICD-10-CM | POA: Insufficient documentation

## 2014-09-26 DIAGNOSIS — J302 Other seasonal allergic rhinitis: Secondary | ICD-10-CM | POA: Diagnosis not present

## 2014-09-26 DIAGNOSIS — Z124 Encounter for screening for malignant neoplasm of cervix: Secondary | ICD-10-CM | POA: Diagnosis not present

## 2014-09-26 LAB — LIPID PANEL
CHOLESTEROL: 169 mg/dL (ref 0–200)
HDL: 62.4 mg/dL (ref >39.00)
LDL CALC: 88 mg/dL (ref 0–99)
NonHDL: 106.39
Total CHOL/HDL Ratio: 3
Triglycerides: 91 mg/dL (ref 0.0–149.0)
VLDL: 18.2 mg/dL (ref 0.0–40.0)

## 2014-09-26 LAB — CBC WITH DIFFERENTIAL/PLATELET
BASOS ABS: 0 10*3/uL (ref 0.0–0.1)
Basophils Relative: 0.7 % (ref 0.0–3.0)
EOS PCT: 0.4 % (ref 0.0–5.0)
Eosinophils Absolute: 0 10*3/uL (ref 0.0–0.7)
HCT: 41.3 % (ref 36.0–46.0)
Hemoglobin: 13.9 g/dL (ref 12.0–15.0)
LYMPHS ABS: 1.1 10*3/uL (ref 0.7–4.0)
Lymphocytes Relative: 20.7 % (ref 12.0–46.0)
MCHC: 33.5 g/dL (ref 30.0–36.0)
MCV: 92.1 fl (ref 78.0–100.0)
MONO ABS: 0.3 10*3/uL (ref 0.1–1.0)
Monocytes Relative: 6.7 % (ref 3.0–12.0)
NEUTROS PCT: 71.5 % (ref 43.0–77.0)
Neutro Abs: 3.7 10*3/uL (ref 1.4–7.7)
PLATELETS: 178 10*3/uL (ref 150.0–400.0)
RBC: 4.48 Mil/uL (ref 3.87–5.11)
RDW: 12.9 % (ref 11.5–15.5)
WBC: 5.2 10*3/uL (ref 4.0–10.5)

## 2014-09-26 LAB — POCT URINALYSIS DIPSTICK
BILIRUBIN UA: NEGATIVE
Blood, UA: NEGATIVE
GLUCOSE UA: NEGATIVE
KETONES UA: NEGATIVE
Leukocytes, UA: NEGATIVE
Nitrite, UA: NEGATIVE
Protein, UA: NEGATIVE
SPEC GRAV UA: 1.02
Urobilinogen, UA: 0.2
pH, UA: 6

## 2014-09-26 LAB — HEPATIC FUNCTION PANEL
ALT: 13 U/L (ref 0–35)
AST: 18 U/L (ref 0–37)
Albumin: 4.4 g/dL (ref 3.5–5.2)
Alkaline Phosphatase: 45 U/L (ref 39–117)
BILIRUBIN TOTAL: 0.6 mg/dL (ref 0.2–1.2)
Bilirubin, Direct: 0.1 mg/dL (ref 0.0–0.3)
Total Protein: 7.4 g/dL (ref 6.0–8.3)

## 2014-09-26 LAB — BASIC METABOLIC PANEL
BUN: 19 mg/dL (ref 6–23)
CALCIUM: 9.7 mg/dL (ref 8.4–10.5)
CO2: 29 mEq/L (ref 19–32)
Chloride: 104 mEq/L (ref 96–112)
Creatinine, Ser: 0.68 mg/dL (ref 0.40–1.20)
GFR: 96.21 mL/min (ref >60.00)
GLUCOSE: 89 mg/dL (ref 70–99)
Potassium: 4.5 mEq/L (ref 3.5–5.1)
Sodium: 138 mEq/L (ref 135–145)

## 2014-09-26 LAB — TSH: TSH: 1.46 u[IU]/mL (ref 0.35–4.50)

## 2014-09-26 MED ORDER — MONTELUKAST SODIUM 10 MG PO TABS: 10.0000 mg | ORAL_TABLET | Freq: Every day | ORAL | Status: DC

## 2014-09-26 NOTE — Progress Notes (Signed)
Subjective:     Shelly Mcgee is a 54 y.o. female and is here for a comprehensive physical exam. The patient reports no problems.  Social History   Social History  . Marital Status: Married    Spouse Name: N/A  . Number of Children: N/A  . Years of Education: N/A   Occupational History  . [employer]--[address]    Social History Main Topics  . Smoking status: Former Smoker -- 0.50 packs/day for 6 years    Quit date: 08/04/1982  . Smokeless tobacco: Never Used  . Alcohol Use: 3.0 oz/week    6 drink(s) per week  . Drug Use: No  . Sexual Activity:    Partners: Male    Birth Control/ Protection: Surgical     Comment: had ablation   Other Topics Concern  . Not on file   Social History Narrative   Exercise-- yoga 3x aweek ,  Walks 10 miles a week   Health Maintenance  Topic Date Due  . Hepatitis C Screening  September 09, 1960  . PAP SMEAR  09/16/2014  . INFLUENZA VACCINE  09/26/2015 (Originally 08/28/2014)  . HIV Screening  09/26/2015 (Originally 10/02/1976)  . MAMMOGRAM  06/27/2016  . COLONOSCOPY  11/12/2017  . TETANUS/TDAP  06/03/2020    The following portions of the patient's history were reviewed and updated as appropriate:  She  has a past medical history of Endometriosis; Anxiety; Cervical dysplasia; and Allergy. She  does not have any pertinent problems on file. She  has past surgical history that includes Cesarean section; Pelvic laparoscopy; Laser ablation of the cervix; Dilation and curettage of uterus; Endometrial ablation; Gynecologic cryosurgery; and Colposcopy (1994). Her family history includes Diabetes in her brother, father, maternal aunt, maternal grandmother, maternal uncle, and mother; Hypertension in her mother; Personality disorder in her sister; Stomach cancer in her mother; Thyroid disease in her paternal grandmother and sister. There is no history of Colon cancer, Esophageal cancer, or Rectal cancer. She  reports that she quit smoking about 32 years ago. She  has never used smokeless tobacco. She reports that she drinks about 3.0 oz of alcohol per week. She reports that she does not use illicit drugs. She has a current medication list which includes the following prescription(s): vitamin c, epinephrine, glucosamine-chondroitin, ibuprofen, multivitamin with minerals, ranitidine, sodium chloride-sodium bicarb, and montelukast. Current Outpatient Prescriptions on File Prior to Visit  Medication Sig Dispense Refill  . Ascorbic Acid (VITAMIN C) 1000 MG tablet Take 2,000 mg by mouth daily.    Marland Kitchen EPINEPHrine (EPIPEN) 0.3 mg/0.3 mL IJ SOAJ injection Inject 0.3 mLs (0.3 mg total) into the muscle once. 2 Device 2  . glucosamine-chondroitin 500-400 MG tablet Take 1 tablet by mouth daily.    . IBUPROFEN PO Take by mouth.    . Multiple Vitamins-Minerals (MULTIVITAMIN WITH MINERALS) tablet Take 1 tablet by mouth daily.    . ranitidine (ZANTAC) 150 MG tablet TAKE 1 TABLET BY MOUTH TWICE DAILY 60 tablet 11  . Sodium Chloride-Sodium Bicarb (AYR SALINE NASAL RINSE NA) Place into the nose as needed.     No current facility-administered medications on file prior to visit.   She is allergic to wasp venom and yellow jacket venom..  Review of Systems Review of Systems  Constitutional: Negative for activity change, appetite change and fatigue.  HENT: Negative for hearing loss, congestion, tinnitus and ear discharge.  dentist q44m Eyes: Negative for visual disturbance (see optho q1y -- vision corrected to 20/20 with glasses).  Respiratory:  Negative for cough, chest tightness and shortness of breath.   Cardiovascular: Negative for chest pain, palpitations and leg swelling.  Gastrointestinal: Negative for abdominal pain, diarrhea, constipation and abdominal distention.  Genitourinary: Negative for urgency, frequency, decreased urine volume and difficulty urinating.  Musculoskeletal: Negative for back pain, arthralgias and gait problem.  Skin: Negative for color change,  pallor and rash.  Neurological: Negative for dizziness, light-headedness, numbness and headaches.  Hematological: Negative for adenopathy. Does not bruise/bleed easily.  Psychiatric/Behavioral: Negative for suicidal ideas, confusion, sleep disturbance, self-injury, dysphoric mood, decreased concentration and agitation.       Objective:    BP 122/83 mmHg  Pulse 61  Temp(Src) 98.3 F (36.8 C) (Oral)  Ht 5\' 6"  (1.676 m)  Wt 145 lb 12.8 oz (66.134 kg)  BMI 23.54 kg/m2  SpO2 98% General appearance: alert, cooperative, appears stated age and no distress Head: Normocephalic, without obvious abnormality, atraumatic Eyes: conjunctivae/corneas clear. PERRL, EOM's intact. Fundi benign. Ears: normal TM's and external ear canals both ears Nose: Nares normal. Septum midline. Mucosa normal. No drainage or sinus tenderness. Throat: lips, mucosa, and tongue normal; teeth and gums normal Neck: no adenopathy, no carotid bruit, no JVD, supple, symmetrical, trachea midline and thyroid not enlarged, symmetric, no tenderness/mass/nodules Back: symmetric, no curvature. ROM normal. No CVA tenderness. Lungs: clear to auscultation bilaterally Breasts: normal appearance, no masses or tenderness Heart: regular rate and rhythm, S1, S2 normal, no murmur, click, rub or gallop Abdomen: soft, non-tender; bowel sounds normal; no masses,  no organomegaly Pelvic: cervix normal in appearance, external genitalia normal, no adnexal masses or tenderness, no cervical motion tenderness, rectovaginal septum normal, uterus normal size, shape, and consistency, vagina normal without discharge and pap done, heme neg brown stool Extremities: extremities normal, atraumatic, no cyanosis or edema Pulses: 2+ and symmetric Skin: Skin color, texture, turgor normal. No rashes or lesions Lymph nodes: Cervical, supraclavicular, and axillary nodes normal. Neurologic: Alert and oriented X 3, normal strength and tone. Normal symmetric  reflexes. Normal coordination and gait Psych- no depression, no anxiety       Assessment:    Healthy female exam.       Plan:    ghm utd Check labs See After Visit Summary for Counseling Recommendations

## 2014-09-26 NOTE — Patient Instructions (Signed)
Preventive Care for Adults A healthy lifestyle and preventive care can promote health and wellness. Preventive health guidelines for women include the following key practices.  A routine yearly physical is a good way to check with your health care provider about your health and preventive screening. It is a chance to share any concerns and updates on your health and to receive a thorough exam.  Visit your dentist for a routine exam and preventive care every 6 months. Brush your teeth twice a day and floss once a day. Good oral hygiene prevents tooth decay and gum disease.  The frequency of eye exams is based on your age, health, family medical history, use of contact lenses, and other factors. Follow your health care provider's recommendations for frequency of eye exams.  Eat a healthy diet. Foods like vegetables, fruits, whole grains, low-fat dairy products, and lean protein foods contain the nutrients you need without too many calories. Decrease your intake of foods high in solid fats, added sugars, and salt. Eat the right amount of calories for you.Get information about a proper diet from your health care provider, if necessary.  Regular physical exercise is one of the most important things you can do for your health. Most adults should get at least 150 minutes of moderate-intensity exercise (any activity that increases your heart rate and causes you to sweat) each week. In addition, most adults need muscle-strengthening exercises on 2 or more days a week.  Maintain a healthy weight. The body mass index (BMI) is a screening tool to identify possible weight problems. It provides an estimate of body fat based on height and weight. Your health care provider can find your BMI and can help you achieve or maintain a healthy weight.For adults 20 years and older:  A BMI below 18.5 is considered underweight.  A BMI of 18.5 to 24.9 is normal.  A BMI of 25 to 29.9 is considered overweight.  A BMI of  30 and above is considered obese.  Maintain normal blood lipids and cholesterol levels by exercising and minimizing your intake of saturated fat. Eat a balanced diet with plenty of fruit and vegetables. Blood tests for lipids and cholesterol should begin at age 76 and be repeated every 5 years. If your lipid or cholesterol levels are high, you are over 50, or you are at high risk for heart disease, you may need your cholesterol levels checked more frequently.Ongoing high lipid and cholesterol levels should be treated with medicines if diet and exercise are not working.  If you smoke, find out from your health care provider how to quit. If you do not use tobacco, do not start.  Lung cancer screening is recommended for adults aged 22-80 years who are at high risk for developing lung cancer because of a history of smoking. A yearly low-dose CT scan of the lungs is recommended for people who have at least a 30-pack-year history of smoking and are a current smoker or have quit within the past 15 years. A pack year of smoking is smoking an average of 1 pack of cigarettes a day for 1 year (for example: 1 pack a day for 30 years or 2 packs a day for 15 years). Yearly screening should continue until the smoker has stopped smoking for at least 15 years. Yearly screening should be stopped for people who develop a health problem that would prevent them from having lung cancer treatment.  If you are pregnant, do not drink alcohol. If you are breastfeeding,  be very cautious about drinking alcohol. If you are not pregnant and choose to drink alcohol, do not have more than 1 drink per day. One drink is considered to be 12 ounces (355 mL) of beer, 5 ounces (148 mL) of wine, or 1.5 ounces (44 mL) of liquor.  Avoid use of street drugs. Do not share needles with anyone. Ask for help if you need support or instructions about stopping the use of drugs.  High blood pressure causes heart disease and increases the risk of  stroke. Your blood pressure should be checked at least every 1 to 2 years. Ongoing high blood pressure should be treated with medicines if weight loss and exercise do not work.  If you are 75-52 years old, ask your health care provider if you should take aspirin to prevent strokes.  Diabetes screening involves taking a blood sample to check your fasting blood sugar level. This should be done once every 3 years, after age 15, if you are within normal weight and without risk factors for diabetes. Testing should be considered at a younger age or be carried out more frequently if you are overweight and have at least 1 risk factor for diabetes.  Breast cancer screening is essential preventive care for women. You should practice "breast self-awareness." This means understanding the normal appearance and feel of your breasts and may include breast self-examination. Any changes detected, no matter how small, should be reported to a health care provider. Women in their 58s and 30s should have a clinical breast exam (CBE) by a health care provider as part of a regular health exam every 1 to 3 years. After age 16, women should have a CBE every year. Starting at age 53, women should consider having a mammogram (breast X-ray test) every year. Women who have a family history of breast cancer should talk to their health care provider about genetic screening. Women at a high risk of breast cancer should talk to their health care providers about having an MRI and a mammogram every year.  Breast cancer gene (BRCA)-related cancer risk assessment is recommended for women who have family members with BRCA-related cancers. BRCA-related cancers include breast, ovarian, tubal, and peritoneal cancers. Having family members with these cancers may be associated with an increased risk for harmful changes (mutations) in the breast cancer genes BRCA1 and BRCA2. Results of the assessment will determine the need for genetic counseling and  BRCA1 and BRCA2 testing.  Routine pelvic exams to screen for cancer are no longer recommended for nonpregnant women who are considered low risk for cancer of the pelvic organs (ovaries, uterus, and vagina) and who do not have symptoms. Ask your health care provider if a screening pelvic exam is right for you.  If you have had past treatment for cervical cancer or a condition that could lead to cancer, you need Pap tests and screening for cancer for at least 20 years after your treatment. If Pap tests have been discontinued, your risk factors (such as having a new sexual partner) need to be reassessed to determine if screening should be resumed. Some women have medical problems that increase the chance of getting cervical cancer. In these cases, your health care provider may recommend more frequent screening and Pap tests.  The HPV test is an additional test that may be used for cervical cancer screening. The HPV test looks for the virus that can cause the cell changes on the cervix. The cells collected during the Pap test can be  tested for HPV. The HPV test could be used to screen women aged 30 years and older, and should be used in women of any age who have unclear Pap test results. After the age of 30, women should have HPV testing at the same frequency as a Pap test.  Colorectal cancer can be detected and often prevented. Most routine colorectal cancer screening begins at the age of 50 years and continues through age 75 years. However, your health care provider may recommend screening at an earlier age if you have risk factors for colon cancer. On a yearly basis, your health care provider may provide home test kits to check for hidden blood in the stool. Use of a small camera at the end of a tube, to directly examine the colon (sigmoidoscopy or colonoscopy), can detect the earliest forms of colorectal cancer. Talk to your health care provider about this at age 50, when routine screening begins. Direct  exam of the colon should be repeated every 5-10 years through age 75 years, unless early forms of pre-cancerous polyps or small growths are found.  People who are at an increased risk for hepatitis B should be screened for this virus. You are considered at high risk for hepatitis B if:  You were born in a country where hepatitis B occurs often. Talk with your health care provider about which countries are considered high risk.  Your parents were born in a high-risk country and you have not received a shot to protect against hepatitis B (hepatitis B vaccine).  You have HIV or AIDS.  You use needles to inject street drugs.  You live with, or have sex with, someone who has hepatitis B.  You get hemodialysis treatment.  You take certain medicines for conditions like cancer, organ transplantation, and autoimmune conditions.  Hepatitis C blood testing is recommended for all people born from 1945 through 1965 and any individual with known risks for hepatitis C.  Practice safe sex. Use condoms and avoid high-risk sexual practices to reduce the spread of sexually transmitted infections (STIs). STIs include gonorrhea, chlamydia, syphilis, trichomonas, herpes, HPV, and human immunodeficiency virus (HIV). Herpes, HIV, and HPV are viral illnesses that have no cure. They can result in disability, cancer, and death.  You should be screened for sexually transmitted illnesses (STIs) including gonorrhea and chlamydia if:  You are sexually active and are younger than 24 years.  You are older than 24 years and your health care provider tells you that you are at risk for this type of infection.  Your sexual activity has changed since you were last screened and you are at an increased risk for chlamydia or gonorrhea. Ask your health care provider if you are at risk.  If you are at risk of being infected with HIV, it is recommended that you take a prescription medicine daily to prevent HIV infection. This is  called preexposure prophylaxis (PrEP). You are considered at risk if:  You are a heterosexual woman, are sexually active, and are at increased risk for HIV infection.  You take drugs by injection.  You are sexually active with a partner who has HIV.  Talk with your health care provider about whether you are at high risk of being infected with HIV. If you choose to begin PrEP, you should first be tested for HIV. You should then be tested every 3 months for as long as you are taking PrEP.  Osteoporosis is a disease in which the bones lose minerals and strength   with aging. This can result in serious bone fractures or breaks. The risk of osteoporosis can be identified using a bone density scan. Women ages 65 years and over and women at risk for fractures or osteoporosis should discuss screening with their health care providers. Ask your health care provider whether you should take a calcium supplement or vitamin D to reduce the rate of osteoporosis.  Menopause can be associated with physical symptoms and risks. Hormone replacement therapy is available to decrease symptoms and risks. You should talk to your health care provider about whether hormone replacement therapy is right for you.  Use sunscreen. Apply sunscreen liberally and repeatedly throughout the day. You should seek shade when your shadow is shorter than you. Protect yourself by wearing long sleeves, pants, a wide-brimmed hat, and sunglasses year round, whenever you are outdoors.  Once a month, do a whole body skin exam, using a mirror to look at the skin on your back. Tell your health care provider of new moles, moles that have irregular borders, moles that are larger than a pencil eraser, or moles that have changed in shape or color.  Stay current with required vaccines (immunizations).  Influenza vaccine. All adults should be immunized every year.  Tetanus, diphtheria, and acellular pertussis (Td, Tdap) vaccine. Pregnant women should  receive 1 dose of Tdap vaccine during each pregnancy. The dose should be obtained regardless of the length of time since the last dose. Immunization is preferred during the 27th-36th week of gestation. An adult who has not previously received Tdap or who does not know her vaccine status should receive 1 dose of Tdap. This initial dose should be followed by tetanus and diphtheria toxoids (Td) booster doses every 10 years. Adults with an unknown or incomplete history of completing a 3-dose immunization series with Td-containing vaccines should begin or complete a primary immunization series including a Tdap dose. Adults should receive a Td booster every 10 years.  Varicella vaccine. An adult without evidence of immunity to varicella should receive 2 doses or a second dose if she has previously received 1 dose. Pregnant females who do not have evidence of immunity should receive the first dose after pregnancy. This first dose should be obtained before leaving the health care facility. The second dose should be obtained 4-8 weeks after the first dose.  Human papillomavirus (HPV) vaccine. Females aged 13-26 years who have not received the vaccine previously should obtain the 3-dose series. The vaccine is not recommended for use in pregnant females. However, pregnancy testing is not needed before receiving a dose. If a female is found to be pregnant after receiving a dose, no treatment is needed. In that case, the remaining doses should be delayed until after the pregnancy. Immunization is recommended for any person with an immunocompromised condition through the age of 26 years if she did not get any or all doses earlier. During the 3-dose series, the second dose should be obtained 4-8 weeks after the first dose. The third dose should be obtained 24 weeks after the first dose and 16 weeks after the second dose.  Zoster vaccine. One dose is recommended for adults aged 60 years or older unless certain conditions are  present.  Measles, mumps, and rubella (MMR) vaccine. Adults born before 1957 generally are considered immune to measles and mumps. Adults born in 1957 or later should have 1 or more doses of MMR vaccine unless there is a contraindication to the vaccine or there is laboratory evidence of immunity to   each of the three diseases. A routine second dose of MMR vaccine should be obtained at least 28 days after the first dose for students attending postsecondary schools, health care workers, or international travelers. People who received inactivated measles vaccine or an unknown type of measles vaccine during 1963-1967 should receive 2 doses of MMR vaccine. People who received inactivated mumps vaccine or an unknown type of mumps vaccine before 1979 and are at high risk for mumps infection should consider immunization with 2 doses of MMR vaccine. For females of childbearing age, rubella immunity should be determined. If there is no evidence of immunity, females who are not pregnant should be vaccinated. If there is no evidence of immunity, females who are pregnant should delay immunization until after pregnancy. Unvaccinated health care workers born before 1957 who lack laboratory evidence of measles, mumps, or rubella immunity or laboratory confirmation of disease should consider measles and mumps immunization with 2 doses of MMR vaccine or rubella immunization with 1 dose of MMR vaccine.  Pneumococcal 13-valent conjugate (PCV13) vaccine. When indicated, a person who is uncertain of her immunization history and has no record of immunization should receive the PCV13 vaccine. An adult aged 19 years or older who has certain medical conditions and has not been previously immunized should receive 1 dose of PCV13 vaccine. This PCV13 should be followed with a dose of pneumococcal polysaccharide (PPSV23) vaccine. The PPSV23 vaccine dose should be obtained at least 8 weeks after the dose of PCV13 vaccine. An adult aged 19  years or older who has certain medical conditions and previously received 1 or more doses of PPSV23 vaccine should receive 1 dose of PCV13. The PCV13 vaccine dose should be obtained 1 or more years after the last PPSV23 vaccine dose.  Pneumococcal polysaccharide (PPSV23) vaccine. When PCV13 is also indicated, PCV13 should be obtained first. All adults aged 65 years and older should be immunized. An adult younger than age 65 years who has certain medical conditions should be immunized. Any person who resides in a nursing home or long-term care facility should be immunized. An adult smoker should be immunized. People with an immunocompromised condition and certain other conditions should receive both PCV13 and PPSV23 vaccines. People with human immunodeficiency virus (HIV) infection should be immunized as soon as possible after diagnosis. Immunization during chemotherapy or radiation therapy should be avoided. Routine use of PPSV23 vaccine is not recommended for American Indians, [place] Natives, or people younger than 65 years unless there are medical conditions that require PPSV23 vaccine. When indicated, people who have unknown immunization and have no record of immunization should receive PPSV23 vaccine. One-time revaccination 5 years after the first dose of PPSV23 is recommended for people aged 19-64 years who have chronic kidney failure, nephrotic syndrome, asplenia, or immunocompromised conditions. People who received 1-2 doses of PPSV23 before age 65 years should receive another dose of PPSV23 vaccine at age 65 years or later if at least 5 years have passed since the previous dose. Doses of PPSV23 are not needed for people immunized with PPSV23 at or after age 65 years.  Meningococcal vaccine. Adults with asplenia or persistent complement component deficiencies should receive 2 doses of quadrivalent meningococcal conjugate (MenACWY-D) vaccine. The doses should be obtained at least 2 months apart.  Microbiologists working with certain meningococcal bacteria, military recruits, people at risk during an outbreak, and people who travel to or live in countries with a high rate of meningitis should be immunized. A first-year college student up through age   21 years who is living in a residence hall should receive a dose if she did not receive a dose on or after her 16th birthday. Adults who have certain high-risk conditions should receive one or more doses of vaccine.  Hepatitis A vaccine. Adults who wish to be protected from this disease, have certain high-risk conditions, work with hepatitis A-infected animals, work in hepatitis A research labs, or travel to or work in countries with a high rate of hepatitis A should be immunized. Adults who were previously unvaccinated and who anticipate close contact with an international adoptee during the first 60 days after arrival in the [place] from a country with a high rate of hepatitis A should be immunized.  Hepatitis B vaccine. Adults who wish to be protected from this disease, have certain high-risk conditions, may be exposed to blood or other infectious body fluids, are household contacts or sex partners of hepatitis B positive people, are clients or workers in certain care facilities, or travel to or work in countries with a high rate of hepatitis B should be immunized.  Haemophilus influenzae type b (Hib) vaccine. A previously unvaccinated person with asplenia or sickle cell disease or having a scheduled splenectomy should receive 1 dose of Hib vaccine. Regardless of previous immunization, a recipient of a hematopoietic stem cell transplant should receive a 3-dose series 6-12 months after her successful transplant. Hib vaccine is not recommended for adults with HIV infection. Preventive Services / Frequency Ages 64 to 68 years  Blood pressure check.** / Every 1 to 2 years.  Lipid and cholesterol check.** / Every 5 years beginning at age  22.  Clinical breast exam.** / Every 3 years for women in their 88s and 53s.  BRCA-related cancer risk assessment.** / For women who have family members with a BRCA-related cancer (breast, ovarian, tubal, or peritoneal cancers).  Pap test.** / Every 2 years from ages 90 through 51. Every 3 years starting at age 21 through age 56 or 3 with a history of 3 consecutive normal Pap tests.  HPV screening.** / Every 3 years from ages 24 through ages 1 to 46 with a history of 3 consecutive normal Pap tests.  Hepatitis C blood test.** / For any individual with known risks for hepatitis C.  Skin self-exam. / Monthly.  Influenza vaccine. / Every year.  Tetanus, diphtheria, and acellular pertussis (Tdap, Td) vaccine.** / Consult your health care provider. Pregnant women should receive 1 dose of Tdap vaccine during each pregnancy. 1 dose of Td every 10 years.  Varicella vaccine.** / Consult your health care provider. Pregnant females who do not have evidence of immunity should receive the first dose after pregnancy.  HPV vaccine. / 3 doses over 6 months, if 72 and younger. The vaccine is not recommended for use in pregnant females. However, pregnancy testing is not needed before receiving a dose.  Measles, mumps, rubella (MMR) vaccine.** / You need at least 1 dose of MMR if you were born in 1957 or later. You may also need a 2nd dose. For females of childbearing age, rubella immunity should be determined. If there is no evidence of immunity, females who are not pregnant should be vaccinated. If there is no evidence of immunity, females who are pregnant should delay immunization until after pregnancy.  Pneumococcal 13-valent conjugate (PCV13) vaccine.** / Consult your health care provider.  Pneumococcal polysaccharide (PPSV23) vaccine.** / 1 to 2 doses if you smoke cigarettes or if you have certain conditions.  Meningococcal vaccine.** /  1 dose if you are age 19 to 21 years and a first-year college  student living in a residence hall, or have one of several medical conditions, you need to get vaccinated against meningococcal disease. You may also need additional booster doses.  Hepatitis A vaccine.** / Consult your health care provider.  Hepatitis B vaccine.** / Consult your health care provider.  Haemophilus influenzae type b (Hib) vaccine.** / Consult your health care provider. Ages 40 to 64 years  Blood pressure check.** / Every 1 to 2 years.  Lipid and cholesterol check.** / Every 5 years beginning at age 20 years.  Lung cancer screening. / Every year if you are aged 55-80 years and have a 30-pack-year history of smoking and currently smoke or have quit within the past 15 years. Yearly screening is stopped once you have quit smoking for at least 15 years or develop a health problem that would prevent you from having lung cancer treatment.  Clinical breast exam.** / Every year after age 40 years.  BRCA-related cancer risk assessment.** / For women who have family members with a BRCA-related cancer (breast, ovarian, tubal, or peritoneal cancers).  Mammogram.** / Every year beginning at age 40 years and continuing for as long as you are in good health. Consult with your health care provider.  Pap test.** / Every 3 years starting at age 30 years through age 65 or 70 years with a history of 3 consecutive normal Pap tests.  HPV screening.** / Every 3 years from ages 30 years through ages 65 to 70 years with a history of 3 consecutive normal Pap tests.  Fecal occult blood test (FOBT) of stool. / Every year beginning at age 50 years and continuing until age 75 years. You may not need to do this test if you get a colonoscopy every 10 years.  Flexible sigmoidoscopy or colonoscopy.** / Every 5 years for a flexible sigmoidoscopy or every 10 years for a colonoscopy beginning at age 50 years and continuing until age 75 years.  Hepatitis C blood test.** / For all people born from 1945 through  1965 and any individual with known risks for hepatitis C.  Skin self-exam. / Monthly.  Influenza vaccine. / Every year.  Tetanus, diphtheria, and acellular pertussis (Tdap/Td) vaccine.** / Consult your health care provider. Pregnant women should receive 1 dose of Tdap vaccine during each pregnancy. 1 dose of Td every 10 years.  Varicella vaccine.** / Consult your health care provider. Pregnant females who do not have evidence of immunity should receive the first dose after pregnancy.  Zoster vaccine.** / 1 dose for adults aged 60 years or older.  Measles, mumps, rubella (MMR) vaccine.** / You need at least 1 dose of MMR if you were born in 1957 or later. You may also need a 2nd dose. For females of childbearing age, rubella immunity should be determined. If there is no evidence of immunity, females who are not pregnant should be vaccinated. If there is no evidence of immunity, females who are pregnant should delay immunization until after pregnancy.  Pneumococcal 13-valent conjugate (PCV13) vaccine.** / Consult your health care provider.  Pneumococcal polysaccharide (PPSV23) vaccine.** / 1 to 2 doses if you smoke cigarettes or if you have certain conditions.  Meningococcal vaccine.** / Consult your health care provider.  Hepatitis A vaccine.** / Consult your health care provider.  Hepatitis B vaccine.** / Consult your health care provider.  Haemophilus influenzae type b (Hib) vaccine.** / Consult your health care provider. Ages 65   years and over  Blood pressure check.** / Every 1 to 2 years.  Lipid and cholesterol check.** / Every 5 years beginning at age 22 years.  Lung cancer screening. / Every year if you are aged 73-80 years and have a 30-pack-year history of smoking and currently smoke or have quit within the past 15 years. Yearly screening is stopped once you have quit smoking for at least 15 years or develop a health problem that would prevent you from having lung cancer  treatment.  Clinical breast exam.** / Every year after age 4 years.  BRCA-related cancer risk assessment.** / For women who have family members with a BRCA-related cancer (breast, ovarian, tubal, or peritoneal cancers).  Mammogram.** / Every year beginning at age 40 years and continuing for as long as you are in good health. Consult with your health care provider.  Pap test.** / Every 3 years starting at age 9 years through age 34 or 91 years with 3 consecutive normal Pap tests. Testing can be stopped between 65 and 70 years with 3 consecutive normal Pap tests and no abnormal Pap or HPV tests in the past 10 years.  HPV screening.** / Every 3 years from ages 57 years through ages 64 or 45 years with a history of 3 consecutive normal Pap tests. Testing can be stopped between 65 and 70 years with 3 consecutive normal Pap tests and no abnormal Pap or HPV tests in the past 10 years.  Fecal occult blood test (FOBT) of stool. / Every year beginning at age 15 years and continuing until age 17 years. You may not need to do this test if you get a colonoscopy every 10 years.  Flexible sigmoidoscopy or colonoscopy.** / Every 5 years for a flexible sigmoidoscopy or every 10 years for a colonoscopy beginning at age 86 years and continuing until age 71 years.  Hepatitis C blood test.** / For all people born from [birth year] through 1964 and any individual with known risks for hepatitis C.  Osteoporosis screening.** / A one-time screening for women ages 83 years and over and women at risk for fractures or osteoporosis.  Skin self-exam. / Monthly.  Influenza vaccine. / Every year.  Tetanus, diphtheria, and acellular pertussis (Tdap/Td) vaccine.** / 1 dose of Td every 10 years.  Varicella vaccine.** / Consult your health care provider.  Zoster vaccine.** / 1 dose for adults aged 61 years or older.  Pneumococcal 13-valent conjugate (PCV13) vaccine.** / Consult your health care provider.  Pneumococcal  polysaccharide (PPSV23) vaccine.** / 1 dose for all adults aged 28 years and older.  Meningococcal vaccine.** / Consult your health care provider.  Hepatitis A vaccine.** / Consult your health care provider.  Hepatitis B vaccine.** / Consult your health care provider.  Haemophilus influenzae type b (Hib) vaccine.** / Consult your health care provider. ** Family history and personal history of risk and conditions may change your health care provider's recommendations. Document Released: 03/11/2001 Document Revised: 05/30/2013 Document Reviewed: 06/10/2010 [facility] Patient Information 2015 [place], [place]. This information is not intended to replace advice given to you by your health care provider. Make sure you discuss any questions you have with your health care provider.

## 2014-09-26 NOTE — Progress Notes (Signed)
Pre visit review using our clinic review tool, if applicable. No additional management support is needed unless otherwise documented below in the visit note. 

## 2014-09-27 LAB — CYTOLOGY - PAP

## 2014-09-27 LAB — HIV ANTIBODY (ROUTINE TESTING W REFLEX): HIV: NONREACTIVE

## 2014-09-27 LAB — HEPATITIS C ANTIBODY: HCV AB: NEGATIVE

## 2014-09-28 ENCOUNTER — Telehealth: Payer: Self-pay | Admitting: Family Medicine

## 2014-09-28 NOTE — Telephone Encounter (Signed)
Caller name: Tyrea Froberg  Relationship to patient: Self   Can be reached:[phone] Pharmacy:  Reason for call: pt is requesting a copy of her most recent lab results. She would like to have them sent to her home address.

## 2014-09-28 NOTE — Telephone Encounter (Signed)
Copy of the labs have been mailed.     KP

## 2014-09-29 ENCOUNTER — Other Ambulatory Visit: Payer: Self-pay

## 2015-10-06 ENCOUNTER — Other Ambulatory Visit: Payer: Self-pay | Admitting: Family Medicine

## 2015-10-06 DIAGNOSIS — J302 Other seasonal allergic rhinitis: Secondary | ICD-10-CM

## 2015-10-14 ENCOUNTER — Other Ambulatory Visit: Payer: Self-pay | Admitting: Family Medicine

## 2015-11-23 ENCOUNTER — Encounter: Payer: Self-pay | Admitting: Family Medicine

## 2015-11-23 ENCOUNTER — Ambulatory Visit (INDEPENDENT_AMBULATORY_CARE_PROVIDER_SITE_OTHER): Payer: PRIVATE HEALTH INSURANCE | Admitting: Family Medicine

## 2015-11-23 ENCOUNTER — Other Ambulatory Visit (HOSPITAL_COMMUNITY)
Admission: RE | Admit: 2015-11-23 | Discharge: 2015-11-23 | Disposition: A | Discharge status: Home / Self Care | Payer: PRIVATE HEALTH INSURANCE | Source: Ambulatory Visit | Attending: Family Medicine | Admitting: Family Medicine

## 2015-11-23 VITALS — BP 110/82 | HR 56 | Temp 98.0°F | Resp 16 | Ht 66.0 in | Wt 150.0 lb

## 2015-11-23 DIAGNOSIS — Z23 Encounter for immunization: Secondary | ICD-10-CM

## 2015-11-23 DIAGNOSIS — J301 Allergic rhinitis due to pollen: Secondary | ICD-10-CM | POA: Diagnosis not present

## 2015-11-23 DIAGNOSIS — Z1151 Encounter for screening for human papillomavirus (HPV): Secondary | ICD-10-CM | POA: Diagnosis not present

## 2015-11-23 DIAGNOSIS — R232 Flushing: Secondary | ICD-10-CM

## 2015-11-23 DIAGNOSIS — Z0000 Encounter for general adult medical examination without abnormal findings: Secondary | ICD-10-CM | POA: Diagnosis not present

## 2015-11-23 DIAGNOSIS — R5383 Other fatigue: Secondary | ICD-10-CM | POA: Diagnosis not present

## 2015-11-23 DIAGNOSIS — Z01419 Encounter for gynecological examination (general) (routine) without abnormal findings: Secondary | ICD-10-CM | POA: Diagnosis present

## 2015-11-23 DIAGNOSIS — K219 Gastro-esophageal reflux disease without esophagitis: Secondary | ICD-10-CM | POA: Diagnosis not present

## 2015-11-23 DIAGNOSIS — Z124 Encounter for screening for malignant neoplasm of cervix: Secondary | ICD-10-CM

## 2015-11-23 LAB — COMPREHENSIVE METABOLIC PANEL
ALT: 18 U/L (ref 0–35)
AST: 18 U/L (ref 0–37)
Albumin: 4.4 g/dL (ref 3.5–5.2)
Alkaline Phosphatase: 47 U/L (ref 39–117)
BILIRUBIN TOTAL: 0.7 mg/dL (ref 0.2–1.2)
BUN: 20 mg/dL (ref 6–23)
CHLORIDE: 105 meq/L (ref 96–112)
CO2: 30 meq/L (ref 19–32)
CREATININE: 0.69 mg/dL (ref 0.40–1.20)
Calcium: 9.5 mg/dL (ref 8.4–10.5)
GFR: 94.18 mL/min (ref >60.00)
GLUCOSE: 98 mg/dL (ref 70–99)
Potassium: 4.9 mEq/L (ref 3.5–5.1)
SODIUM: 138 meq/L (ref 135–145)
Total Protein: 7.2 g/dL (ref 6.0–8.3)

## 2015-11-23 LAB — LIPID PANEL
CHOL/HDL RATIO: 2
Cholesterol: 174 mg/dL (ref 0–200)
HDL: 70.2 mg/dL (ref >39.00)
LDL CALC: 90 mg/dL (ref 0–99)
NONHDL: 104.1
TRIGLYCERIDES: 72 mg/dL (ref 0.0–149.0)
VLDL: 14.4 mg/dL (ref 0.0–40.0)

## 2015-11-23 LAB — CBC WITH DIFFERENTIAL/PLATELET
BASOS ABS: 0 10*3/uL (ref 0.0–0.1)
Basophils Relative: 0.8 % (ref 0.0–3.0)
EOS ABS: 0 10*3/uL (ref 0.0–0.7)
Eosinophils Relative: 0.3 % (ref 0.0–5.0)
HEMATOCRIT: 38.7 % (ref 36.0–46.0)
Hemoglobin: 13 g/dL (ref 12.0–15.0)
LYMPHS PCT: 23.4 % (ref 12.0–46.0)
Lymphs Abs: 1.1 10*3/uL (ref 0.7–4.0)
MCHC: 33.5 g/dL (ref 30.0–36.0)
MCV: 90.7 fl (ref 78.0–100.0)
MONOS PCT: 6.8 % (ref 3.0–12.0)
Monocytes Absolute: 0.3 10*3/uL (ref 0.1–1.0)
NEUTROS ABS: 3.3 10*3/uL (ref 1.4–7.7)
NEUTROS PCT: 68.7 % (ref 43.0–77.0)
PLATELETS: 192 10*3/uL (ref 150.0–400.0)
RBC: 4.27 Mil/uL (ref 3.87–5.11)
RDW: 12.4 % (ref 11.5–15.5)
WBC: 4.8 10*3/uL (ref 4.0–10.5)

## 2015-11-23 LAB — POCT URINALYSIS DIPSTICK
BILIRUBIN UA: NEGATIVE
GLUCOSE UA: NEGATIVE
KETONES UA: NEGATIVE
Leukocytes, UA: NEGATIVE
NITRITE UA: NEGATIVE
PH UA: 6
Protein, UA: NEGATIVE
RBC UA: NEGATIVE
Spec Grav, UA: 1.025
Urobilinogen, UA: NEGATIVE

## 2015-11-23 MED ORDER — RANITIDINE HCL 150 MG PO TABS: 150.0000 mg | ORAL_TABLET | Freq: Two times a day (BID) | ORAL | 3 refills | Status: DC

## 2015-11-23 MED ORDER — MONTELUKAST SODIUM 10 MG PO TABS
ORAL_TABLET | ORAL | 3 refills | Status: DC
Start: 2015-11-23 — End: 2016-11-28

## 2015-11-23 NOTE — Progress Notes (Signed)
Subjective:     Shelly Mcgee is a 55 y.o. female and is here for a comprehensive physical exam. The patient reports no problems.  Social History   Social History  . Marital status: Married    Spouse name: N/A  . Number of children: N/A  . Years of education: N/A   Occupational History  . [employer]   Social History Main Topics  . Smoking status: Former Smoker    Packs/day: 0.50    Years: 6.00    Quit date: 08/04/1982  . Smokeless tobacco: Never Used  . Alcohol use 3.0 oz/week    6 drink(s) per week  . Drug use: No  . Sexual activity: Yes    Partners: Male    Birth control/ protection: Surgical     Comment: had ablation   Other Topics Concern  . Not on file   Social History Narrative   Exercise-- yoga and tai chi , body pump   Health Maintenance  Topic Date Due  . PAP SMEAR  09/26/2015  . INFLUENZA VACCINE  11/22/2016 (Originally 08/28/2015)  . MAMMOGRAM  06/27/2016  . COLONOSCOPY  11/12/2017  . TETANUS/TDAP  06/03/2020  . Hepatitis C Screening  Completed  . HIV Screening  Completed    The following portions of the patient's history were reviewed and updated as appropriate:  She  has a past medical history of Allergy; Anxiety; Cervical dysplasia; and Endometriosis. She  does not have any pertinent problems on file. She  has a past surgical history that includes Cesarean section; Pelvic laparoscopy; Laser ablation of the cervix; Dilation and curettage of uterus; Endometrial ablation; Gynecologic cryosurgery; and Colposcopy (1994). Her family history includes Diabetes in her brother, father, maternal aunt, maternal grandmother, maternal uncle, and mother; Hypertension in her mother; Personality disorder in her sister; Stomach cancer in her mother; Thyroid disease in her paternal grandmother and sister. She  reports that she quit smoking about 33 years ago. She has a 3.00 pack-year smoking history. She has never used smokeless  tobacco. She reports that she drinks about 3.0 oz of alcohol per week . She reports that she does not use drugs. She has a current medication list which includes the following prescription(s): vitamin c, epinephrine, glucosamine-chondroitin, ibuprofen, montelukast, multivitamin with minerals, ranitidine, and sodium chloride-sodium bicarb. Current Outpatient Prescriptions on File Prior to Visit  Medication Sig Dispense Refill  . Ascorbic Acid (VITAMIN C) 1000 MG tablet Take 2,000 mg by mouth daily.    Marland Kitchen EPINEPHrine (EPIPEN) 0.3 mg/0.3 mL IJ SOAJ injection Inject 0.3 mLs (0.3 mg total) into the muscle once. 2 Device 2  . glucosamine-chondroitin 500-400 MG tablet Take 1 tablet by mouth daily.    . IBUPROFEN PO Take by mouth.    . montelukast (SINGULAIR) 10 MG tablet TAKE 1 TABLET(10 MG) BY MOUTH AT BEDTIME 90 tablet 0  . Multiple Vitamins-Minerals (MULTIVITAMIN WITH MINERALS) tablet Take 1 tablet by mouth daily.    . ranitidine (ZANTAC) 150 MG tablet TAKE 1 TABLET BY MOUTH TWICE DAILY 60 tablet 0  . Sodium Chloride-Sodium Bicarb (AYR SALINE NASAL RINSE NA) Place into the nose as needed.     No current facility-administered medications on file prior to visit.    She is allergic to wasp venom and yellow jacket venom [bee venom]..  Review of Systems Review of Systems  Constitutional: Negative for activity change, appetite change and fatigue.  HENT: Negative for hearing loss, congestion, tinnitus and ear discharge.  dentist q36mEyes: Negative for visual disturbance (see optho q1y ) Respiratory: Negative for cough, chest tightness and shortness of breath.   Cardiovascular: Negative for chest pain, palpitations and leg swelling.  Gastrointestinal: Negative for abdominal pain, diarrhea, constipation and abdominal distention.  Genitourinary: Negative for urgency, frequency, decreased urine volume and difficulty urinating.  Musculoskeletal: Negative for back pain, arthralgias and gait problem.  Skin:  Negative for color change, pallor and rash.  Neurological: Negative for dizziness, light-headedness, numbness and headaches.  Hematological: Negative for adenopathy. Does not bruise/bleed easily.  Psychiatric/Behavioral: Negative for suicidal ideas, confusion, sleep disturbance, self-injury, dysphoric mood, decreased concentration and agitation.       Objective:    BP 110/82 (BP Location: Left Arm, Patient Position: Sitting, Cuff Size: Large)   Pulse (!) 56   Temp 98 F (36.7 C) (Oral)   Resp 16   Ht 5' 6"  (1.676 m)   Wt 150 lb (68 kg)   SpO2 98%   BMI 24.21 kg/m  General appearance: alert, cooperative, appears stated age and no distress Head: Normocephalic, without obvious abnormality, atraumatic Eyes: conjunctivae/corneas clear. PERRL, EOM's intact. Fundi benign. Ears: normal TM's and external ear canals both ears Nose: Nares normal. Septum midline. Mucosa normal. No drainage or sinus tenderness. Throat: lips, mucosa, and tongue normal; teeth and gums normal Neck: no adenopathy, no carotid bruit, no JVD, supple, symmetrical, trachea midline and thyroid not enlarged, symmetric, no tenderness/mass/nodules Back: symmetric, no curvature. ROM normal. No CVA tenderness. Lungs: clear to auscultation bilaterally Breasts: normal appearance, no masses or tenderness Heart: regular rate and rhythm, S1, S2 normal, no murmur, click, rub or gallop Abdomen: soft, non-tender; bowel sounds normal; no masses,  no organomegaly Pelvic: cervix normal in appearance, external genitalia normal, no adnexal masses or tenderness, no cervical motion tenderness, rectovaginal septum normal, uterus normal size, shape, and consistency, vagina normal without discharge and pap done Extremities: extremities normal, atraumatic, no cyanosis or edema Pulses: 2+ and symmetric Skin: Skin color, texture, turgor normal. No rashes or lesions Lymph nodes: Cervical, supraclavicular, and axillary nodes normal. Neurologic:  Alert and oriented X 3, normal strength and tone. Normal symmetric reflexes. Normal coordination and gait    Assessment:    Healthy female exam.      Plan:    ghm utd Check labs See After Visit Summary for Counseling Recommendations

## 2015-11-23 NOTE — Patient Instructions (Signed)
Preventive Care for Adults, Female A healthy lifestyle and preventive care can promote health and wellness. Preventive health guidelines for women include the following key practices.  A routine yearly physical is a good way to check with your health care provider about your health and preventive screening. It is a chance to share any concerns and updates on your health and to receive a thorough exam.  Visit your dentist for a routine exam and preventive care every 6 months. Brush your teeth twice a day and floss once a day. Good oral hygiene prevents tooth decay and gum disease.  The frequency of eye exams is based on your age, health, family medical history, use of contact lenses, and other factors. Follow your health care provider's recommendations for frequency of eye exams.  Eat a healthy diet. Foods like vegetables, fruits, whole grains, low-fat dairy products, and lean protein foods contain the nutrients you need without too many calories. Decrease your intake of foods high in solid fats, added sugars, and salt. Eat the right amount of calories for you.Get information about a proper diet from your health care provider, if necessary.  Regular physical exercise is one of the most important things you can do for your health. Most adults should get at least 150 minutes of moderate-intensity exercise (any activity that increases your heart rate and causes you to sweat) each week. In addition, most adults need muscle-strengthening exercises on 2 or more days a week.  Maintain a healthy weight. The body mass index (BMI) is a screening tool to identify possible weight problems. It provides an estimate of body fat based on height and weight. Your health care provider can find your BMI and can help you achieve or maintain a healthy weight.For adults 20 years and older:  A BMI below 18.5 is considered underweight.  A BMI of 18.5 to 24.9 is normal.  A BMI of 25 to 29.9 is considered overweight.  A  BMI of 30 and above is considered obese.  Maintain normal blood lipids and cholesterol levels by exercising and minimizing your intake of saturated fat. Eat a balanced diet with plenty of fruit and vegetables. Blood tests for lipids and cholesterol should begin at age 45 and be repeated every 5 years. If your lipid or cholesterol levels are high, you are over 50, or you are at high risk for heart disease, you may need your cholesterol levels checked more frequently.Ongoing high lipid and cholesterol levels should be treated with medicines if diet and exercise are not working.  If you smoke, find out from your health care provider how to quit. If you do not use tobacco, do not start.  Lung cancer screening is recommended for adults aged 45-80 years who are at high risk for developing lung cancer because of a history of smoking. A yearly low-dose CT scan of the lungs is recommended for people who have at least a 30-pack-year history of smoking and are a current smoker or have quit within the past 15 years. A pack year of smoking is smoking an average of 1 pack of cigarettes a day for 1 year (for example: 1 pack a day for 30 years or 2 packs a day for 15 years). Yearly screening should continue until the smoker has stopped smoking for at least 15 years. Yearly screening should be stopped for people who develop a health problem that would prevent them from having lung cancer treatment.  If you are pregnant, do not drink alcohol. If you are  breastfeeding, be very cautious about drinking alcohol. If you are not pregnant and choose to drink alcohol, do not have more than 1 drink per day. One drink is considered to be 12 ounces (355 mL) of beer, 5 ounces (148 mL) of wine, or 1.5 ounces (44 mL) of liquor.  Avoid use of street drugs. Do not share needles with anyone. Ask for help if you need support or instructions about stopping the use of drugs.  High blood pressure causes heart disease and increases the risk  of stroke. Your blood pressure should be checked at least every 1 to 2 years. Ongoing high blood pressure should be treated with medicines if weight loss and exercise do not work.  If you are 55-79 years old, ask your health care provider if you should take aspirin to prevent strokes.  Diabetes screening is done by taking a blood sample to check your blood glucose level after you have not eaten for a certain period of time (fasting). If you are not overweight and you do not have risk factors for diabetes, you should be screened once every 3 years starting at age 45. If you are overweight or obese and you are 40-70 years of age, you should be screened for diabetes every year as part of your cardiovascular risk assessment.  Breast cancer screening is essential preventive care for women. You should practice "breast self-awareness." This means understanding the normal appearance and feel of your breasts and may include breast self-examination. Any changes detected, no matter how small, should be reported to a health care provider. Women in their 20s and 30s should have a clinical breast exam (CBE) by a health care provider as part of a regular health exam every 1 to 3 years. After age 40, women should have a CBE every year. Starting at age 40, women should consider having a mammogram (breast X-ray test) every year. Women who have a family history of breast cancer should talk to their health care provider about genetic screening. Women at a high risk of breast cancer should talk to their health care providers about having an MRI and a mammogram every year.  Breast cancer gene (BRCA)-related cancer risk assessment is recommended for women who have family members with BRCA-related cancers. BRCA-related cancers include breast, ovarian, tubal, and peritoneal cancers. Having family members with these cancers may be associated with an increased risk for harmful changes (mutations) in the breast cancer genes BRCA1 and  BRCA2. Results of the assessment will determine the need for genetic counseling and BRCA1 and BRCA2 testing.  Your health care provider may recommend that you be screened regularly for cancer of the pelvic organs (ovaries, uterus, and vagina). This screening involves a pelvic examination, including checking for microscopic changes to the surface of your cervix (Pap test). You may be encouraged to have this screening done every 3 years, beginning at age 21.  For women ages 30-65, health care providers may recommend pelvic exams and Pap testing every 3 years, or they may recommend the Pap and pelvic exam, combined with testing for human papilloma virus (HPV), every 5 years. Some types of HPV increase your risk of cervical cancer. Testing for HPV may also be done on women of any age with unclear Pap test results.  Other health care providers may not recommend any screening for nonpregnant women who are considered low risk for pelvic cancer and who do not have symptoms. Ask your health care provider if a screening pelvic exam is right for   you.  If you have had past treatment for cervical cancer or a condition that could lead to cancer, you need Pap tests and screening for cancer for at least 20 years after your treatment. If Pap tests have been discontinued, your risk factors (such as having a new sexual partner) need to be reassessed to determine if screening should resume. Some women have medical problems that increase the chance of getting cervical cancer. In these cases, your health care provider may recommend more frequent screening and Pap tests.  Colorectal cancer can be detected and often prevented. Most routine colorectal cancer screening begins at the age of 50 years and continues through age 75 years. However, your health care provider may recommend screening at an earlier age if you have risk factors for colon cancer. On a yearly basis, your health care provider may provide home test kits to check  for hidden blood in the stool. Use of a small camera at the end of a tube, to directly examine the colon (sigmoidoscopy or colonoscopy), can detect the earliest forms of colorectal cancer. Talk to your health care provider about this at age 50, when routine screening begins. Direct exam of the colon should be repeated every 5-10 years through age 75 years, unless early forms of precancerous polyps or small growths are found.  People who are at an increased risk for hepatitis B should be screened for this virus. You are considered at high risk for hepatitis B if:  You were born in a country where hepatitis B occurs often. Talk with your health care provider about which countries are considered high risk.  Your parents were born in a high-risk country and you have not received a shot to protect against hepatitis B (hepatitis B vaccine).  You have HIV or AIDS.  You use needles to inject street drugs.  You live with, or have sex with, someone who has hepatitis B.  You get hemodialysis treatment.  You take certain medicines for conditions like cancer, organ transplantation, and autoimmune conditions.  Hepatitis C blood testing is recommended for all people born from 1945 through 1965 and any individual with known risks for hepatitis C.  Practice safe sex. Use condoms and avoid high-risk sexual practices to reduce the spread of sexually transmitted infections (STIs). STIs include gonorrhea, chlamydia, syphilis, trichomonas, herpes, HPV, and human immunodeficiency virus (HIV). Herpes, HIV, and HPV are viral illnesses that have no cure. They can result in disability, cancer, and death.  You should be screened for sexually transmitted illnesses (STIs) including gonorrhea and chlamydia if:  You are sexually active and are younger than 24 years.  You are older than 24 years and your health care provider tells you that you are at risk for this type of infection.  Your sexual activity has changed  since you were last screened and you are at an increased risk for chlamydia or gonorrhea. Ask your health care provider if you are at risk.  If you are at risk of being infected with HIV, it is recommended that you take a prescription medicine daily to prevent HIV infection. This is called preexposure prophylaxis (PrEP). You are considered at risk if:  You are sexually active and do not regularly use condoms or know the HIV status of your partner(s).  You take drugs by injection.  You are sexually active with a partner who has HIV.  Talk with your health care provider about whether you are at high risk of being infected with HIV. If   you choose to begin PrEP, you should first be tested for HIV. You should then be tested every 3 months for as long as you are taking PrEP.  Osteoporosis is a disease in which the bones lose minerals and strength with aging. This can result in serious bone fractures or breaks. The risk of osteoporosis can be identified using a bone density scan. Women ages 67 years and over and women at risk for fractures or osteoporosis should discuss screening with their health care providers. Ask your health care provider whether you should take a calcium supplement or vitamin D to reduce the rate of osteoporosis.  Menopause can be associated with physical symptoms and risks. Hormone replacement therapy is available to decrease symptoms and risks. You should talk to your health care provider about whether hormone replacement therapy is right for you.  Use sunscreen. Apply sunscreen liberally and repeatedly throughout the day. You should seek shade when your shadow is shorter than you. Protect yourself by wearing long sleeves, pants, a wide-brimmed hat, and sunglasses year round, whenever you are outdoors.  Once a month, do a whole body skin exam, using a mirror to look at the skin on your back. Tell your health care provider of new moles, moles that have irregular borders, moles that  are larger than a pencil eraser, or moles that have changed in shape or color.  Stay current with required vaccines (immunizations).  Influenza vaccine. All adults should be immunized every year.  Tetanus, diphtheria, and acellular pertussis (Td, Tdap) vaccine. Pregnant women should receive 1 dose of Tdap vaccine during each pregnancy. The dose should be obtained regardless of the length of time since the last dose. Immunization is preferred during the 27th-36th week of gestation. An adult who has not previously received Tdap or who does not know her vaccine status should receive 1 dose of Tdap. This initial dose should be followed by tetanus and diphtheria toxoids (Td) booster doses every 10 years. Adults with an unknown or incomplete history of completing a 3-dose immunization series with Td-containing vaccines should begin or complete a primary immunization series including a Tdap dose. Adults should receive a Td booster every 10 years.  Varicella vaccine. An adult without evidence of immunity to varicella should receive 2 doses or a second dose if she has previously received 1 dose. Pregnant females who do not have evidence of immunity should receive the first dose after pregnancy. This first dose should be obtained before leaving the health care facility. The second dose should be obtained 4-8 weeks after the first dose.  Human papillomavirus (HPV) vaccine. Females aged 13-26 years who have not received the vaccine previously should obtain the 3-dose series. The vaccine is not recommended for use in pregnant females. However, pregnancy testing is not needed before receiving a dose. If a female is found to be pregnant after receiving a dose, no treatment is needed. In that case, the remaining doses should be delayed until after the pregnancy. Immunization is recommended for any person with an immunocompromised condition through the age of 61 years if she did not get any or all doses earlier. During the  3-dose series, the second dose should be obtained 4-8 weeks after the first dose. The third dose should be obtained 24 weeks after the first dose and 16 weeks after the second dose.  Zoster vaccine. One dose is recommended for adults aged 30 years or older unless certain conditions are present.  Measles, mumps, and rubella (MMR) vaccine. Adults born  before 1957 generally are considered immune to measles and mumps. Adults born in 1957 or later should have 1 or more doses of MMR vaccine unless there is a contraindication to the vaccine or there is laboratory evidence of immunity to each of the three diseases. A routine second dose of MMR vaccine should be obtained at least 28 days after the first dose for students attending postsecondary schools, health care workers, or international travelers. People who received inactivated measles vaccine or an unknown type of measles vaccine during 1963-1967 should receive 2 doses of MMR vaccine. People who received inactivated mumps vaccine or an unknown type of mumps vaccine before 1979 and are at high risk for mumps infection should consider immunization with 2 doses of MMR vaccine. For females of childbearing age, rubella immunity should be determined. If there is no evidence of immunity, females who are not pregnant should be vaccinated. If there is no evidence of immunity, females who are pregnant should delay immunization until after pregnancy. Unvaccinated health care workers born before 1957 who lack laboratory evidence of measles, mumps, or rubella immunity or laboratory confirmation of disease should consider measles and mumps immunization with 2 doses of MMR vaccine or rubella immunization with 1 dose of MMR vaccine.  Pneumococcal 13-valent conjugate (PCV13) vaccine. When indicated, a person who is uncertain of his immunization history and has no record of immunization should receive the PCV13 vaccine. All adults 65 years of age and older should receive this  vaccine. An adult aged 19 years or older who has certain medical conditions and has not been previously immunized should receive 1 dose of PCV13 vaccine. This PCV13 should be followed with a dose of pneumococcal polysaccharide (PPSV23) vaccine. Adults who are at high risk for pneumococcal disease should obtain the PPSV23 vaccine at least 8 weeks after the dose of PCV13 vaccine. Adults older than 54 years of age who have normal immune system function should obtain the PPSV23 vaccine dose at least 1 year after the dose of PCV13 vaccine.  Pneumococcal polysaccharide (PPSV23) vaccine. When PCV13 is also indicated, PCV13 should be obtained first. All adults aged 65 years and older should be immunized. An adult younger than age 65 years who has certain medical conditions should be immunized. Any person who resides in a nursing home or long-term care facility should be immunized. An adult smoker should be immunized. People with an immunocompromised condition and certain other conditions should receive both PCV13 and PPSV23 vaccines. People with human immunodeficiency virus (HIV) infection should be immunized as soon as possible after diagnosis. Immunization during chemotherapy or radiation therapy should be avoided. Routine use of PPSV23 vaccine is not recommended for American Indians, [place] Natives, or people younger than 65 years unless there are medical conditions that require PPSV23 vaccine. When indicated, people who have unknown immunization and have no record of immunization should receive PPSV23 vaccine. One-time revaccination 5 years after the first dose of PPSV23 is recommended for people aged 19-64 years who have chronic kidney failure, nephrotic syndrome, asplenia, or immunocompromised conditions. People who received 1-2 doses of PPSV23 before age 65 years should receive another dose of PPSV23 vaccine at age 65 years or later if at least 5 years have passed since the previous dose. Doses of PPSV23 are not  needed for people immunized with PPSV23 at or after age 65 years.  Meningococcal vaccine. Adults with asplenia or persistent complement component deficiencies should receive 2 doses of quadrivalent meningococcal conjugate (MenACWY-D) vaccine. The doses should be obtained   at least 2 months apart. Microbiologists working with certain meningococcal bacteria, [place] recruits, people at risk during an outbreak, and people who travel to or live in countries with a high rate of meningitis should be immunized. A first-year college student up through age 34 years who is living in a residence hall should receive a dose if she did not receive a dose on or after her 16th birthday. Adults who have certain high-risk conditions should receive one or more doses of vaccine.  Hepatitis A vaccine. Adults who wish to be protected from this disease, have certain high-risk conditions, work with hepatitis A-infected animals, work in hepatitis A research labs, or travel to or work in countries with a high rate of hepatitis A should be immunized. Adults who were previously unvaccinated and who anticipate close contact with an international adoptee during the first 60 days after arrival in the [place] from a country with a high rate of hepatitis A should be immunized.  Hepatitis B vaccine. Adults who wish to be protected from this disease, have certain high-risk conditions, may be exposed to blood or other infectious body fluids, are household contacts or sex partners of hepatitis B positive people, are clients or workers in certain care facilities, or travel to or work in countries with a high rate of hepatitis B should be immunized.  Haemophilus influenzae type b (Hib) vaccine. A previously unvaccinated person with asplenia or sickle cell disease or having a scheduled splenectomy should receive 1 dose of Hib vaccine. Regardless of previous immunization, a recipient of a hematopoietic stem cell transplant should receive a  3-dose series 6-12 months after her successful transplant. Hib vaccine is not recommended for adults with HIV infection. Preventive Services / Frequency Ages 35 to 4 years  Blood pressure check.** / Every 3-5 years.  Lipid and cholesterol check.** / Every 5 years beginning at age 60.  Clinical breast exam.** / Every 3 years for women in their 71s and 10s.  BRCA-related cancer risk assessment.** / For women who have family members with a BRCA-related cancer (breast, ovarian, tubal, or peritoneal cancers).  Pap test.** / Every 2 years from ages 76 through 26. Every 3 years starting at age 61 through age 76 or 93 with a history of 3 consecutive normal Pap tests.  HPV screening.** / Every 3 years from ages 37 through ages 60 to 51 with a history of 3 consecutive normal Pap tests.  Hepatitis C blood test.** / For any individual with known risks for hepatitis C.  Skin self-exam. / Monthly.  Influenza vaccine. / Every year.  Tetanus, diphtheria, and acellular pertussis (Tdap, Td) vaccine.** / Consult your health care provider. Pregnant women should receive 1 dose of Tdap vaccine during each pregnancy. 1 dose of Td every 10 years.  Varicella vaccine.** / Consult your health care provider. Pregnant females who do not have evidence of immunity should receive the first dose after pregnancy.  HPV vaccine. / 3 doses over 6 months, if 93 and younger. The vaccine is not recommended for use in pregnant females. However, pregnancy testing is not needed before receiving a dose.  Measles, mumps, rubella (MMR) vaccine.** / You need at least 1 dose of MMR if you were born in 1957 or later. You may also need a 2nd dose. For females of childbearing age, rubella immunity should be determined. If there is no evidence of immunity, females who are not pregnant should be vaccinated. If there is no evidence of immunity, females who are  pregnant should delay immunization until after pregnancy.  Pneumococcal  13-valent conjugate (PCV13) vaccine.** / Consult your health care provider.  Pneumococcal polysaccharide (PPSV23) vaccine.** / 1 to 2 doses if you smoke cigarettes or if you have certain conditions.  Meningococcal vaccine.** / 1 dose if you are age 68 to 8 years and a Market researcher living in a residence hall, or have one of several medical conditions, you need to get vaccinated against meningococcal disease. You may also need additional booster doses.  Hepatitis A vaccine.** / Consult your health care provider.  Hepatitis B vaccine.** / Consult your health care provider.  Haemophilus influenzae type b (Hib) vaccine.** / Consult your health care provider. Ages 7 to 53 years  Blood pressure check.** / Every year.  Lipid and cholesterol check.** / Every 5 years beginning at age 25 years.  Lung cancer screening. / Every year if you are aged 11-80 years and have a 30-pack-year history of smoking and currently smoke or have quit within the past 15 years. Yearly screening is stopped once you have quit smoking for at least 15 years or develop a health problem that would prevent you from having lung cancer treatment.  Clinical breast exam.** / Every year after age 48 years.  BRCA-related cancer risk assessment.** / For women who have family members with a BRCA-related cancer (breast, ovarian, tubal, or peritoneal cancers).  Mammogram.** / Every year beginning at age 41 years and continuing for as long as you are in good health. Consult with your health care provider.  Pap test.** / Every 3 years starting at age 65 years through age 37 or 70 years with a history of 3 consecutive normal Pap tests.  HPV screening.** / Every 3 years from ages 72 years through ages 60 to 40 years with a history of 3 consecutive normal Pap tests.  Fecal occult blood test (FOBT) of stool. / Every year beginning at age 21 years and continuing until age 5 years. You may not need to do this test if you get  a colonoscopy every 10 years.  Flexible sigmoidoscopy or colonoscopy.** / Every 5 years for a flexible sigmoidoscopy or every 10 years for a colonoscopy beginning at age 35 years and continuing until age 48 years.  Hepatitis C blood test.** / For all people born from [birth year] through 1965 and any individual with known risks for hepatitis C.  Skin self-exam. / Monthly.  Influenza vaccine. / Every year.  Tetanus, diphtheria, and acellular pertussis (Tdap/Td) vaccine.** / Consult your health care provider. Pregnant women should receive 1 dose of Tdap vaccine during each pregnancy. 1 dose of Td every 10 years.  Varicella vaccine.** / Consult your health care provider. Pregnant females who do not have evidence of immunity should receive the first dose after pregnancy.  Zoster vaccine.** / 1 dose for adults aged 30 years or older.  Measles, mumps, rubella (MMR) vaccine.** / You need at least 1 dose of MMR if you were born in 1957 or later. You may also need a second dose. For females of childbearing age, rubella immunity should be determined. If there is no evidence of immunity, females who are not pregnant should be vaccinated. If there is no evidence of immunity, females who are pregnant should delay immunization until after pregnancy.  Pneumococcal 13-valent conjugate (PCV13) vaccine.** / Consult your health care provider.  Pneumococcal polysaccharide (PPSV23) vaccine.** / 1 to 2 doses if you smoke cigarettes or if you have certain conditions.  Meningococcal vaccine.** /  Consult your health care provider.  Hepatitis A vaccine.** / Consult your health care provider.  Hepatitis B vaccine.** / Consult your health care provider.  Haemophilus influenzae type b (Hib) vaccine.** / Consult your health care provider. Ages 64 years and over  Blood pressure check.** / Every year.  Lipid and cholesterol check.** / Every 5 years beginning at age 23 years.  Lung cancer screening. / Every year if you  are aged 16-80 years and have a 30-pack-year history of smoking and currently smoke or have quit within the past 15 years. Yearly screening is stopped once you have quit smoking for at least 15 years or develop a health problem that would prevent you from having lung cancer treatment.  Clinical breast exam.** / Every year after age 74 years.  BRCA-related cancer risk assessment.** / For women who have family members with a BRCA-related cancer (breast, ovarian, tubal, or peritoneal cancers).  Mammogram.** / Every year beginning at age 44 years and continuing for as long as you are in good health. Consult with your health care provider.  Pap test.** / Every 3 years starting at age 58 years through age 22 or 39 years with 3 consecutive normal Pap tests. Testing can be stopped between 65 and 70 years with 3 consecutive normal Pap tests and no abnormal Pap or HPV tests in the past 10 years.  HPV screening.** / Every 3 years from ages 64 years through ages 70 or 61 years with a history of 3 consecutive normal Pap tests. Testing can be stopped between 65 and 70 years with 3 consecutive normal Pap tests and no abnormal Pap or HPV tests in the past 10 years.  Fecal occult blood test (FOBT) of stool. / Every year beginning at age 40 years and continuing until age 27 years. You may not need to do this test if you get a colonoscopy every 10 years.  Flexible sigmoidoscopy or colonoscopy.** / Every 5 years for a flexible sigmoidoscopy or every 10 years for a colonoscopy beginning at age 7 years and continuing until age 32 years.  Hepatitis C blood test.** / For all people born from [birth year] through 1964 and any individual with known risks for hepatitis C.  Osteoporosis screening.** / A one-time screening for women ages 30 years and over and women at risk for fractures or osteoporosis.  Skin self-exam. / Monthly.  Influenza vaccine. / Every year.  Tetanus, diphtheria, and acellular pertussis (Tdap/Td)  vaccine.** / 1 dose of Td every 10 years.  Varicella vaccine.** / Consult your health care provider.  Zoster vaccine.** / 1 dose for adults aged 35 years or older.  Pneumococcal 13-valent conjugate (PCV13) vaccine.** / Consult your health care provider.  Pneumococcal polysaccharide (PPSV23) vaccine.** / 1 dose for all adults aged 46 years and older.  Meningococcal vaccine.** / Consult your health care provider.  Hepatitis A vaccine.** / Consult your health care provider.  Hepatitis B vaccine.** / Consult your health care provider.  Haemophilus influenzae type b (Hib) vaccine.** / Consult your health care provider. ** Family history and personal history of risk and conditions may change your health care provider's recommendations.   This information is not intended to replace advice given to you by your health care provider. Make sure you discuss any questions you have with your health care provider.   Document Released: 03/11/2001 Document Revised: 02/03/2014 Document Reviewed: 06/10/2010 Elsevier Interactive Patient Education [employer].

## 2015-11-23 NOTE — Progress Notes (Signed)
Pre visit review using our clinic review tool, if applicable. No additional management support is needed unless otherwise documented below in the visit note. 

## 2015-11-26 LAB — CYTOLOGY - PAP
Diagnosis: NEGATIVE
HPV: NOT DETECTED

## 2015-11-26 LAB — VITAMIN B12: VITAMIN B 12: 334 pg/mL (ref 211–911)

## 2015-11-26 LAB — TSH: TSH: 0.98 u[IU]/mL (ref 0.35–4.50)

## 2016-11-28 ENCOUNTER — Ambulatory Visit (INDEPENDENT_AMBULATORY_CARE_PROVIDER_SITE_OTHER): Payer: PRIVATE HEALTH INSURANCE | Admitting: Family Medicine

## 2016-11-28 ENCOUNTER — Other Ambulatory Visit (HOSPITAL_COMMUNITY)
Admission: RE | Admit: 2016-11-28 | Discharge: 2016-11-28 | Disposition: A | Discharge status: Home / Self Care | Payer: PRIVATE HEALTH INSURANCE | Source: Ambulatory Visit | Attending: Family Medicine | Admitting: Family Medicine

## 2016-11-28 ENCOUNTER — Encounter: Payer: Self-pay | Admitting: Family Medicine

## 2016-11-28 VITALS — BP 125/88 | HR 53 | Temp 98.2°F | Resp 16 | Ht 66.5 in | Wt 147.2 lb

## 2016-11-28 DIAGNOSIS — Z01419 Encounter for gynecological examination (general) (routine) without abnormal findings: Secondary | ICD-10-CM | POA: Diagnosis not present

## 2016-11-28 DIAGNOSIS — J301 Allergic rhinitis due to pollen: Secondary | ICD-10-CM | POA: Diagnosis not present

## 2016-11-28 DIAGNOSIS — K219 Gastro-esophageal reflux disease without esophagitis: Secondary | ICD-10-CM

## 2016-11-28 DIAGNOSIS — Z0000 Encounter for general adult medical examination without abnormal findings: Secondary | ICD-10-CM

## 2016-11-28 LAB — POCT URINALYSIS DIPSTICK
Bilirubin, UA: NEGATIVE
Blood, UA: NEGATIVE
Glucose, UA: NEGATIVE
Ketones, UA: NEGATIVE
LEUKOCYTES UA: NEGATIVE
NITRITE UA: NEGATIVE
PH UA: 6 (ref 5.0–8.0)
PROTEIN UA: NEGATIVE
Spec Grav, UA: 1.01 (ref 1.010–1.025)
Urobilinogen, UA: 0.2 E.U./dL

## 2016-11-28 MED ORDER — NITROFURANTOIN MONOHYD MACRO 100 MG PO CAPS: 100.0000 mg | ORAL_CAPSULE | Freq: Two times a day (BID) | ORAL | 0 refills | Status: DC

## 2016-11-28 MED ORDER — MONTELUKAST SODIUM 10 MG PO TABS: ORAL_TABLET | ORAL | 3 refills | Status: DC

## 2016-11-28 MED ORDER — RANITIDINE HCL 150 MG PO TABS: 150.0000 mg | ORAL_TABLET | Freq: Two times a day (BID) | ORAL | 3 refills | Status: DC

## 2016-11-28 NOTE — Progress Notes (Signed)
Subjective:   I acted as a Education administrator for Dr. Carollee Herter.  Guerry Bruin, CMA   Shelly Mcgee is a 56 y.o. female and is here for a comprehensive physical exam. The patient reports no problems.  Social History   Social History  . Marital status: Married    Spouse name: N/A  . Number of children: N/A  . Years of education: N/A   Occupational History  . [employer]    12 hour shift   Social History Main Topics  . Smoking status: Former Smoker    Packs/day: 0.50    Years: 6.00    Quit date: 08/04/1982  . Smokeless tobacco: Never Used  . Alcohol use 3.0 oz/week    6 drink(s) per week  . Drug use: No  . Sexual activity: Yes    Partners: Male    Birth control/ protection: Surgical     Comment: had ablation   Other Topics Concern  . Not on file   Social History Narrative   Exercise-- cycling   Health Maintenance  Topic Date Due  . MAMMOGRAM  06/28/2015  . PAP SMEAR  11/22/2016  . COLONOSCOPY  11/12/2017  . TETANUS/TDAP  06/03/2020  . INFLUENZA VACCINE  Completed  . Hepatitis C Screening  Completed  . HIV Screening  Completed    The following portions of the patient's history were reviewed and updated as appropriate:  She  has a past medical history of Allergy; Anxiety; Cervical dysplasia; and Endometriosis. She  does not have any pertinent problems on file. She  has a past surgical history that includes Cesarean section; Pelvic laparoscopy; Laser ablation of the cervix; Dilation and curettage of uterus; Endometrial ablation; Gynecologic cryosurgery; and Colposcopy (1994). Her family history includes Diabetes in her brother, father, maternal aunt, maternal grandmother, maternal uncle, and mother; Hypertension in her mother; Personality disorder in her sister; Stomach cancer in her mother; Thyroid disease in her paternal grandmother and sister. She  reports that she quit smoking about 34 years ago. She has a 3.00 pack-year smoking history.  She has never used smokeless tobacco. She reports that she drinks about 3.0 oz of alcohol per week . She reports that she does not use drugs. She has a current medication list which includes the following prescription(s): vitamin c, epinephrine, glucosamine-chondroitin, ibuprofen, montelukast, multivitamin with minerals, ranitidine, sodium chloride-sodium bicarb, and nitrofurantoin (macrocrystal-monohydrate). Current Outpatient Prescriptions on File Prior to Visit  Medication Sig Dispense Refill  . Ascorbic Acid (VITAMIN C) 1000 MG tablet Take 2,000 mg by mouth daily.    Marland Kitchen EPINEPHrine (EPIPEN) 0.3 mg/0.3 mL IJ SOAJ injection Inject 0.3 mLs (0.3 mg total) into the muscle once. 2 Device 2  . glucosamine-chondroitin 500-400 MG tablet Take 1 tablet by mouth daily.    . IBUPROFEN PO Take by mouth.    . Multiple Vitamins-Minerals (MULTIVITAMIN WITH MINERALS) tablet Take 1 tablet by mouth daily.    . Sodium Chloride-Sodium Bicarb (AYR SALINE NASAL RINSE NA) Place into the nose as needed.     No current facility-administered medications on file prior to visit.    She is allergic to wasp venom and yellow jacket venom [bee venom]..  Review of Systems Review of Systems  Constitutional: Negative for activity change, appetite change and fatigue.  HENT: Negative for hearing loss, congestion, tinnitus and ear discharge.  dentist q70m Eyes: Negative for visual disturbance (see optho q1y -- vision corrected to 20/20 with glasses).  Respiratory: Negative for cough, chest  tightness and shortness of breath.   Cardiovascular: Negative for chest pain, palpitations and leg swelling.  Gastrointestinal: Negative for abdominal pain, diarrhea, constipation and abdominal distention.  Genitourinary: Negative for urgency, frequency, decreased urine volume and difficulty urinating.  Musculoskeletal: Negative for back pain, arthralgias and gait problem.  Skin: Negative for color change, pallor and rash.  Neurological:  Negative for dizziness, light-headedness, numbness and headaches.  Hematological: Negative for adenopathy. Does not bruise/bleed easily.  Psychiatric/Behavioral: Negative for suicidal ideas, confusion, sleep disturbance, self-injury, dysphoric mood, decreased concentration and agitation.       Objective:    BP 125/88 (BP Location: Left Arm, Cuff Size: Normal)   Pulse (!) 53   Temp 98.2 F (36.8 C) (Oral)   Resp 16   Ht 5' 6.5" (1.689 m)   Wt 147 lb 3.2 oz (66.8 kg)   SpO2 98%   BMI 23.40 kg/m  General appearance: alert, cooperative, appears stated age and no distress Head: Normocephalic, without obvious abnormality, atraumatic Eyes: conjunctivae/corneas clear. PERRL, EOM's intact. Fundi benign. Ears: normal TM's and external ear canals both ears Nose: Nares normal. Septum midline. Mucosa normal. No drainage or sinus tenderness. Throat: lips, mucosa, and tongue normal; teeth and gums normal Neck: no adenopathy, no carotid bruit, no JVD, supple, symmetrical, trachea midline and thyroid not enlarged, symmetric, no tenderness/mass/nodules Back: symmetric, no curvature. ROM normal. No CVA tenderness. Lungs: clear to auscultation bilaterally Breasts: normal appearance, no masses or tenderness Heart: regular rate and rhythm, S1, S2 normal, no murmur, click, rub or gallop Abdomen: soft, non-tender; bowel sounds normal; no masses,  no organomegaly Pelvic: cervix normal in appearance, external genitalia normal, no adnexal masses or tenderness, no cervical motion tenderness, rectovaginal septum normal, uterus normal size, shape, and consistency, vagina normal without discharge and pap done, rectal heme neg brown stool Extremities: extremities normal, atraumatic, no cyanosis or edema Pulses: 2+ and symmetric Skin: Skin color, texture, turgor normal. No rashes or lesions Lymph nodes: Cervical, supraclavicular, and axillary nodes normal. Neurologic: Alert and oriented X 3, normal strength and  tone. Normal symmetric reflexes. Normal coordination and gait    Assessment:    Healthy female exam.      Plan:  Pt refused shingrix   ghm utd Check labs  See After Visit Summary for Counseling Recommendations

## 2016-11-28 NOTE — Patient Instructions (Signed)
Preventive Care 40-64 Years, Female Preventive care refers to lifestyle choices and visits with your health care provider that can promote health and wellness. What does preventive care include?  A yearly physical exam. This is also called an annual well check.  Dental exams once or twice a year.  Routine eye exams. Ask your health care provider how often you should have your eyes checked.  Personal lifestyle choices, including: ? Daily care of your teeth and gums. ? Regular physical activity. ? Eating a healthy diet. ? Avoiding tobacco and drug use. ? Limiting alcohol use. ? Practicing safe sex. ? Taking low-dose aspirin daily starting at age 58. ? Taking vitamin and mineral supplements as recommended by your health care provider. What happens during an annual well check? The services and screenings done by your health care provider during your annual well check will depend on your age, overall health, lifestyle risk factors, and family history of disease. Counseling Your health care provider may ask you questions about your:  Alcohol use.  Tobacco use.  Drug use.  Emotional well-being.  Home and relationship well-being.  Sexual activity.  Eating habits.  Work and work Statistician.  Method of birth control.  Menstrual cycle.  Pregnancy history.  Screening You may have the following tests or measurements:  Height, weight, and BMI.  Blood pressure.  Lipid and cholesterol levels. These may be checked every 5 years, or more frequently if you are over 81 years old.  Skin check.  Lung cancer screening. You may have this screening every year starting at age 78 if you have a 30-pack-year history of smoking and currently smoke or have quit within the past 15 years.  Fecal occult blood test (FOBT) of the stool. You may have this test every year starting at age 65.  Flexible sigmoidoscopy or colonoscopy. You may have a sigmoidoscopy every 5 years or a colonoscopy  every 10 years starting at age 30.  Hepatitis C blood test.  Hepatitis B blood test.  Sexually transmitted disease (STD) testing.  Diabetes screening. This is done by checking your blood sugar (glucose) after you have not eaten for a while (fasting). You may have this done every 1-3 years.  Mammogram. This may be done every 1-2 years. Talk to your health care provider about when you should start having regular mammograms. This may depend on whether you have a family history of breast cancer.  BRCA-related cancer screening. This may be done if you have a family history of breast, ovarian, tubal, or peritoneal cancers.  Pelvic exam and Pap test. This may be done every 3 years starting at age 80. Starting at age 36, this may be done every 5 years if you have a Pap test in combination with an HPV test.  Bone density scan. This is done to screen for osteoporosis. You may have this scan if you are at high risk for osteoporosis.  Discuss your test results, treatment options, and if necessary, the need for more tests with your health care provider. Vaccines Your health care provider may recommend certain vaccines, such as:  Influenza vaccine. This is recommended every year.  Tetanus, diphtheria, and acellular pertussis (Tdap, Td) vaccine. You may need a Td booster every 10 years.  Varicella vaccine. You may need this if you have not been vaccinated.  Zoster vaccine. You may need this after age 5.  Measles, mumps, and rubella (MMR) vaccine. You may need at least one dose of MMR if you were born in  1956 or later. You may also need a second dose.  Pneumococcal 13-valent conjugate (PCV13) vaccine. You may need this if you have certain conditions and were not previously vaccinated.  Pneumococcal polysaccharide (PPSV23) vaccine. You may need one or two doses if you smoke cigarettes or if you have certain conditions.  Meningococcal vaccine. You may need this if you have certain  conditions.  Hepatitis A vaccine. You may need this if you have certain conditions or if you travel or work in places where you may be exposed to hepatitis A.  Hepatitis B vaccine. You may need this if you have certain conditions or if you travel or work in places where you may be exposed to hepatitis B.  Haemophilus influenzae type b (Hib) vaccine. You may need this if you have certain conditions.  Talk to your health care provider about which screenings and vaccines you need and how often you need them. This information is not intended to replace advice given to you by your health care provider. Make sure you discuss any questions you have with your health care provider. Document Released: 02/09/2015 Document Revised: 10/03/2015 Document Reviewed: 11/14/2014 Elsevier Interactive Patient Education  2017 [employer].

## 2016-12-01 LAB — CYTOLOGY - PAP
Diagnosis: NEGATIVE
HPV (WINDOPATH): NOT DETECTED

## 2016-12-20 ENCOUNTER — Other Ambulatory Visit: Payer: Self-pay | Admitting: Family Medicine

## 2016-12-20 DIAGNOSIS — J301 Allergic rhinitis due to pollen: Secondary | ICD-10-CM

## 2017-02-03 ENCOUNTER — Other Ambulatory Visit: Payer: Self-pay | Admitting: Family Medicine

## 2017-02-18 ENCOUNTER — Other Ambulatory Visit: Payer: Self-pay | Admitting: Family Medicine

## 2017-02-18 DIAGNOSIS — Z1231 Encounter for screening mammogram for malignant neoplasm of breast: Secondary | ICD-10-CM

## 2017-02-23 ENCOUNTER — Ambulatory Visit
Admission: RE | Admit: 2017-02-23 | Discharge: 2017-02-23 | Disposition: A | Discharge status: Home / Self Care | Payer: PRIVATE HEALTH INSURANCE | Source: Ambulatory Visit | Attending: Family Medicine | Admitting: Family Medicine

## 2017-02-23 DIAGNOSIS — Z1231 Encounter for screening mammogram for malignant neoplasm of breast: Secondary | ICD-10-CM

## 2017-10-28 ENCOUNTER — Other Ambulatory Visit: Payer: Self-pay | Admitting: Family Medicine

## 2017-10-28 NOTE — Telephone Encounter (Addendum)
Copied from [place] [id number]. Topic: Quick Communication - See Telephone Encounter >> Oct 28, 2017  9:44 AM Ivar Drape wrote: CRM for notification. See Telephone encounter for: 10/28/17. Patient stated she is now taking ranitidine (ZANTAC) 150 MG tablet medication, but it is being recalled and she wants to know if there is something else she can take. If another prescription is prescribed, please send it to her preferred pharmacy [facility] on [address]  Please advise.

## 2017-10-28 NOTE — Telephone Encounter (Signed)
Famotidine 20mg  tabs, 1 tab po bid prn reflux. She can get OTC of we can send in a prescription 30 or 90 day at patient discretion is fine.

## 2017-10-29 MED ORDER — FAMOTIDINE 20 MG PO TABS: 20.0000 mg | ORAL_TABLET | Freq: Two times a day (BID) | ORAL | 5 refills | Status: DC | PRN

## 2017-10-29 NOTE — Addendum Note (Signed)
Addended byDamita Dunnings D on: 10/29/2017 04:17 PM   Modules accepted: Orders

## 2017-10-29 NOTE — Telephone Encounter (Signed)
Author phoned pt. To relay Dr. Frederik Pear recommendation, no answer. Author left generic VM, asking for return call. PEC OK to discuss Dr. Frederik Pear message re: famotidine.

## 2017-10-29 NOTE — Telephone Encounter (Signed)
Rx sent 

## 2017-10-29 NOTE — Telephone Encounter (Signed)
Pt called back and was given message from Dr. Charlett Blake.   Pt does want RX called in for 30 days to  [facility], [address] AT [facility] [address] [phone] (Phone) [phone] (Fax)

## 2017-11-23 ENCOUNTER — Encounter: Payer: Self-pay | Admitting: Gastroenterology

## 2017-12-03 ENCOUNTER — Telehealth: Payer: Self-pay | Admitting: *Deleted

## 2017-12-03 NOTE — Telephone Encounter (Signed)
Received Medical records from [facility]; forwarded to provider/SLS 11/07  

## 2017-12-21 ENCOUNTER — Other Ambulatory Visit: Payer: Self-pay | Admitting: Family Medicine

## 2017-12-21 DIAGNOSIS — J301 Allergic rhinitis due to pollen: Secondary | ICD-10-CM

## 2017-12-22 NOTE — Telephone Encounter (Signed)
Pt requesting refill on Singulair last ov 11/28/16. Please advise.

## 2017-12-29 ENCOUNTER — Encounter: Payer: PRIVATE HEALTH INSURANCE | Admitting: Family Medicine

## 2018-01-04 ENCOUNTER — Other Ambulatory Visit (HOSPITAL_COMMUNITY)
Admission: RE | Admit: 2018-01-04 | Discharge: 2018-01-04 | Disposition: A | Discharge status: Home / Self Care | Payer: PRIVATE HEALTH INSURANCE | Source: Ambulatory Visit | Attending: Family Medicine | Admitting: Family Medicine

## 2018-01-04 ENCOUNTER — Ambulatory Visit (INDEPENDENT_AMBULATORY_CARE_PROVIDER_SITE_OTHER): Payer: PRIVATE HEALTH INSURANCE | Admitting: Family Medicine

## 2018-01-04 ENCOUNTER — Encounter: Payer: Self-pay | Admitting: Family Medicine

## 2018-01-04 VITALS — BP 116/80 | HR 54 | Temp 98.2°F | Resp 16 | Ht 67.0 in | Wt 146.0 lb

## 2018-01-04 DIAGNOSIS — Z0000 Encounter for general adult medical examination without abnormal findings: Secondary | ICD-10-CM | POA: Insufficient documentation

## 2018-01-04 DIAGNOSIS — J301 Allergic rhinitis due to pollen: Secondary | ICD-10-CM

## 2018-01-04 DIAGNOSIS — K219 Gastro-esophageal reflux disease without esophagitis: Secondary | ICD-10-CM

## 2018-01-04 LAB — CBC WITH DIFFERENTIAL/PLATELET
Basophils Absolute: 0.1 10*3/uL (ref 0.0–0.1)
Basophils Relative: 2 % (ref 0.0–3.0)
Eosinophils Absolute: 0 10*3/uL (ref 0.0–0.7)
Eosinophils Relative: 0.5 % (ref 0.0–5.0)
HCT: 40.3 % (ref 36.0–46.0)
HEMOGLOBIN: 13.5 g/dL (ref 12.0–15.0)
Lymphocytes Relative: 34.5 % (ref 12.0–46.0)
Lymphs Abs: 2 10*3/uL (ref 0.7–4.0)
MCHC: 33.5 g/dL (ref 30.0–36.0)
MCV: 91.2 fl (ref 78.0–100.0)
MONOS PCT: 7.6 % (ref 3.0–12.0)
Monocytes Absolute: 0.4 10*3/uL (ref 0.1–1.0)
Neutro Abs: 3.2 10*3/uL (ref 1.4–7.7)
Neutrophils Relative %: 55.4 % (ref 43.0–77.0)
Platelets: 210 10*3/uL (ref 150.0–400.0)
RBC: 4.42 Mil/uL (ref 3.87–5.11)
RDW: 12.5 % (ref 11.5–15.5)
WBC: 5.8 10*3/uL (ref 4.0–10.5)

## 2018-01-04 LAB — COMPREHENSIVE METABOLIC PANEL
ALT: 15 U/L (ref 0–35)
AST: 19 U/L (ref 0–37)
Albumin: 4.9 g/dL (ref 3.5–5.2)
Alkaline Phosphatase: 60 U/L (ref 39–117)
BUN: 18 mg/dL (ref 6–23)
CO2: 30 mEq/L (ref 19–32)
Calcium: 9.7 mg/dL (ref 8.4–10.5)
Chloride: 101 mEq/L (ref 96–112)
Creatinine, Ser: 0.73 mg/dL (ref 0.40–1.20)
GFR: 87.57 mL/min (ref >60.00)
GLUCOSE: 91 mg/dL (ref 70–99)
Potassium: 4 mEq/L (ref 3.5–5.1)
Sodium: 139 mEq/L (ref 135–145)
Total Bilirubin: 0.6 mg/dL (ref 0.2–1.2)
Total Protein: 7.3 g/dL (ref 6.0–8.3)

## 2018-01-04 LAB — LIPID PANEL
Cholesterol: 200 mg/dL (ref 0–200)
HDL: 81.4 mg/dL (ref >39.00)
LDL Cholesterol: 105 mg/dL — ABNORMAL HIGH (ref 0–99)
NonHDL: 118.72
Total CHOL/HDL Ratio: 2
Triglycerides: 70 mg/dL (ref 0.0–149.0)
VLDL: 14 mg/dL (ref 0.0–40.0)

## 2018-01-04 LAB — TSH: TSH: 1.23 u[IU]/mL (ref 0.35–4.50)

## 2018-01-04 MED ORDER — MONTELUKAST SODIUM 10 MG PO TABS: ORAL_TABLET | ORAL | 3 refills | Status: DC

## 2018-01-04 MED ORDER — FAMOTIDINE 20 MG PO TABS: 20.0000 mg | ORAL_TABLET | Freq: Two times a day (BID) | ORAL | 5 refills | Status: DC | PRN

## 2018-01-04 NOTE — Progress Notes (Signed)
Subjective:     Shelly Mcgee is a 57 y.o. female and is here for a comprehensive physical exam. The patient reports no problems.  Social History   Socioeconomic History  . Marital status: Married    Spouse name: Not on file  . Number of children: Not on file  . Years of education: Not on file  . Highest education level: Not on file  Occupational History  . Occupation: [employer]    Employer: [place]    Comment: 12 hour shift  Social Needs  . Financial resource strain: Not on file  . Food insecurity:    Worry: Not on file    Inability: Not on file  . Transportation needs:    Medical: Not on file    Non-medical: Not on file  Tobacco Use  . Smoking status: Former Smoker    Packs/day: 0.50    Years: 6.00    Pack years: 3.00    Last attempt to quit: 08/04/1982    Years since quitting: 35.4  . Smokeless tobacco: Never Used  Substance and Sexual Activity  . Alcohol use: Yes    Alcohol/week: 6.0 standard drinks    Types: 6 drink(s) per week  . Drug use: No  . Sexual activity: Yes    Partners: Male    Birth control/protection: Surgical    Comment: had ablation  Lifestyle  . Physical activity:    Days per week: Not on file    Minutes per session: Not on file  . Stress: Not on file  Relationships  . Social connections:    Talks on phone: Not on file    Gets together: Not on file    Attends religious service: Not on file    Active member of club or organization: Not on file    Attends meetings of clubs or organizations: Not on file    Relationship status: Not on file  . Intimate partner violence:    Fear of current or ex partner: Not on file    Emotionally abused: Not on file    Physically abused: Not on file    Forced sexual activity: Not on file  Other Topics Concern  . Not on file  Social History Narrative   Exercise-- cycling   Health Maintenance  Topic Date Due  . COLONOSCOPY  11/12/2017  . PAP SMEAR  11/28/2017  .  MAMMOGRAM  02/23/2018  . TETANUS/TDAP  06/03/2020  . INFLUENZA VACCINE  Completed  . Hepatitis C Screening  Completed  . HIV Screening  Completed    The following portions of the patient's history were reviewed and updated as appropriate:  She  has a past medical history of Allergy, Anxiety, Cervical dysplasia, and Endometriosis. She does not have any pertinent problems on file. She  has a past surgical history that includes Cesarean section; Pelvic laparoscopy; Laser ablation of the cervix; Dilation and curettage of uterus; Endometrial ablation; Gynecologic cryosurgery; and Colposcopy (1994). Her family history includes Diabetes in her brother, father, maternal aunt, maternal grandmother, maternal uncle, and mother; Hypertension in her mother; Personality disorder in her sister; Stomach cancer in her mother; Thyroid disease in her paternal grandmother and sister. She  reports that she quit smoking about 35 years ago. She has a 3.00 pack-year smoking history. She has never used smokeless tobacco. She reports that she drinks about 6.0 standard drinks of alcohol per week. She reports that she does not use drugs. She has a current medication  list which includes the following prescription(s): vitamin c, epinephrine, famotidine, glucosamine-chondroitin, ibuprofen, montelukast, multivitamin with minerals, and sodium chloride-sodium bicarb. Current Outpatient Medications on File Prior to Visit  Medication Sig Dispense Refill  . Ascorbic Acid (VITAMIN C) 1000 MG tablet Take 2,000 mg by mouth daily.    Marland Kitchen EPINEPHrine (EPIPEN) 0.3 mg/0.3 mL IJ SOAJ injection Inject 0.3 mLs (0.3 mg total) into the muscle once. 2 Device 2  . glucosamine-chondroitin 500-400 MG tablet Take 1 tablet by mouth daily.    . IBUPROFEN PO Take by mouth.    . Multiple Vitamins-Minerals (MULTIVITAMIN WITH MINERALS) tablet Take 1 tablet by mouth daily.    . Sodium Chloride-Sodium Bicarb (AYR SALINE NASAL RINSE NA) Place into the nose as  needed.     No current facility-administered medications on file prior to visit.    She is allergic to wasp venom and yellow jacket venom [bee venom]..  Review of Systems Review of Systems  Constitutional: Negative for activity change, appetite change and fatigue.  HENT: Negative for hearing loss, congestion, tinnitus and ear discharge.  dentist q16m Eyes: Negative for visual disturbance (see optho q1y -- vision corrected to 20/20 with glasses).  Respiratory: Negative for cough, chest tightness and shortness of breath.   Cardiovascular: Negative for chest pain, palpitations and leg swelling.  Gastrointestinal: Negative for abdominal pain, diarrhea, constipation and abdominal distention.  Genitourinary: Negative for urgency, frequency, decreased urine volume and difficulty urinating.  Musculoskeletal: Negative for back pain, arthralgias and gait problem.  Skin: Negative for color change, pallor and rash.  Neurological: Negative for dizziness, light-headedness, numbness and headaches.  Hematological: Negative for adenopathy. Does not bruise/bleed easily.  Psychiatric/Behavioral: Negative for suicidal ideas, confusion, sleep disturbance, self-injury, dysphoric mood, decreased concentration and agitation.       Objective:    BP 116/80 (BP Location: Right Arm, Cuff Size: Normal)   Pulse (!) 54   Temp 98.2 F (36.8 C) (Oral)   Resp 16   Ht 5\' 7"  (1.702 m)   Wt 146 lb (66.2 kg)   SpO2 98%   BMI 22.87 kg/m  General appearance: alert, cooperative, appears stated age and no distress Head: Normocephalic, without obvious abnormality, atraumatic Eyes: conjunctivae/corneas clear. PERRL, EOM's intact. Fundi benign. Ears: normal TM's and external ear canals both ears Nose: Nares normal. Septum midline. Mucosa normal. No drainage or sinus tenderness. Throat: lips, mucosa, and tongue normal; teeth and gums normal Neck: no adenopathy, no carotid bruit, no JVD, supple, symmetrical, trachea  midline and thyroid not enlarged, symmetric, no tenderness/mass/nodules Back: symmetric, no curvature. ROM normal. No CVA tenderness. Lungs: clear to auscultation bilaterally Breasts: normal appearance, no masses or tenderness Heart: regular rate and rhythm, S1, S2 normal, no murmur, click, rub or gallop Abdomen: soft, non-tender; bowel sounds normal; no masses,  no organomegaly Pelvic: cervix normal in appearance, external genitalia normal, no adnexal masses or tenderness, no cervical motion tenderness, rectovaginal septum normal, uterus normal size, shape, and consistency, vagina normal without discharge and bme normal,  pap done, rectal heme neg brown stool Extremities: extremities normal, atraumatic, no cyanosis or edema Pulses: 2+ and symmetric Skin: Skin color, texture, turgor normal. No rashes or lesions Lymph nodes: Cervical, supraclavicular, and axillary nodes normal. Neurologic: Alert and oriented X 3, normal strength and tone. Normal symmetric reflexes. Normal coordination and gait    Assessment:    Healthy female exam.      Plan:    ghm utd Check labs  See After Visit Summary for Counseling  Recommendations    1. Chronic seasonal allergic rhinitis due to pollen  - montelukast (SINGULAIR) 10 MG tablet; 1 po qd  Dispense: 90 tablet; Refill: 3  2. Gastroesophageal reflux disease, esophagitis presence not specified  - famotidine (PEPCID) 20 MG tablet; Take 1 tablet (20 mg total) by mouth 2 (two) times daily as needed for heartburn or indigestion.  Dispense: 30 tablet; Refill: 5  3. Preventative health care ghm utd Check labs  - CBC with Differential/Platelet - Lipid panel - TSH - Comprehensive metabolic panel - Cytology - PAP( [place])

## 2018-01-04 NOTE — Patient Instructions (Signed)
Preventive Care 40-64 Years, Female Preventive care refers to lifestyle choices and visits with your health care provider that can promote health and wellness. What does preventive care include?  A yearly physical exam. This is also called an annual well check.  Dental exams once or twice a year.  Routine eye exams. Ask your health care provider how often you should have your eyes checked.  Personal lifestyle choices, including: ? Daily care of your teeth and gums. ? Regular physical activity. ? Eating a healthy diet. ? Avoiding tobacco and drug use. ? Limiting alcohol use. ? Practicing safe sex. ? Taking low-dose aspirin daily starting at age 58. ? Taking vitamin and mineral supplements as recommended by your health care provider. What happens during an annual well check? The services and screenings done by your health care provider during your annual well check will depend on your age, overall health, lifestyle risk factors, and family history of disease. Counseling Your health care provider may ask you questions about your:  Alcohol use.  Tobacco use.  Drug use.  Emotional well-being.  Home and relationship well-being.  Sexual activity.  Eating habits.  Work and work Statistician.  Method of birth control.  Menstrual cycle.  Pregnancy history.  Screening You may have the following tests or measurements:  Height, weight, and BMI.  Blood pressure.  Lipid and cholesterol levels. These may be checked every 5 years, or more frequently if you are over 81 years old.  Skin check.  Lung cancer screening. You may have this screening every year starting at age 78 if you have a 30-pack-year history of smoking and currently smoke or have quit within the past 15 years.  Fecal occult blood test (FOBT) of the stool. You may have this test every year starting at age 65.  Flexible sigmoidoscopy or colonoscopy. You may have a sigmoidoscopy every 5 years or a colonoscopy  every 10 years starting at age 30.  Hepatitis C blood test.  Hepatitis B blood test.  Sexually transmitted disease (STD) testing.  Diabetes screening. This is done by checking your blood sugar (glucose) after you have not eaten for a while (fasting). You may have this done every 1-3 years.  Mammogram. This may be done every 1-2 years. Talk to your health care provider about when you should start having regular mammograms. This may depend on whether you have a family history of breast cancer.  BRCA-related cancer screening. This may be done if you have a family history of breast, ovarian, tubal, or peritoneal cancers.  Pelvic exam and Pap test. This may be done every 3 years starting at age 80. Starting at age 36, this may be done every 5 years if you have a Pap test in combination with an HPV test.  Bone density scan. This is done to screen for osteoporosis. You may have this scan if you are at high risk for osteoporosis.  Discuss your test results, treatment options, and if necessary, the need for more tests with your health care provider. Vaccines Your health care provider may recommend certain vaccines, such as:  Influenza vaccine. This is recommended every year.  Tetanus, diphtheria, and acellular pertussis (Tdap, Td) vaccine. You may need a Td booster every 10 years.  Varicella vaccine. You may need this if you have not been vaccinated.  Zoster vaccine. You may need this after age 5.  Measles, mumps, and rubella (MMR) vaccine. You may need at least one dose of MMR if you were born in  1956 or later. You may also need a second dose.  Pneumococcal 13-valent conjugate (PCV13) vaccine. You may need this if you have certain conditions and were not previously vaccinated.  Pneumococcal polysaccharide (PPSV23) vaccine. You may need one or two doses if you smoke cigarettes or if you have certain conditions.  Meningococcal vaccine. You may need this if you have certain  conditions.  Hepatitis A vaccine. You may need this if you have certain conditions or if you travel or work in places where you may be exposed to hepatitis A.  Hepatitis B vaccine. You may need this if you have certain conditions or if you travel or work in places where you may be exposed to hepatitis B.  Haemophilus influenzae type b (Hib) vaccine. You may need this if you have certain conditions.  Talk to your health care provider about which screenings and vaccines you need and how often you need them. This information is not intended to replace advice given to you by your health care provider. Make sure you discuss any questions you have with your health care provider. Document Released: 02/09/2015 Document Revised: 10/03/2015 Document Reviewed: 11/14/2014 Elsevier Interactive Patient Education  2018 Elsevier Inc.  

## 2018-01-05 ENCOUNTER — Telehealth: Payer: Self-pay | Admitting: *Deleted

## 2018-01-05 NOTE — Telephone Encounter (Signed)
-----   Message from Ann Held, DO sent at 01/04/2018  1:45 PM EST ----- I need to know the name of the cream she used for shingles ----she got it at [facility]

## 2018-01-07 LAB — CYTOLOGY - PAP
Diagnosis: NEGATIVE
HPV: NOT DETECTED

## 2018-01-08 NOTE — Telephone Encounter (Signed)
Let her know --- maybe she has tube at home

## 2018-01-08 NOTE — Telephone Encounter (Signed)
They did not see anything for shingles.  The only thing she got was a month ago was desonide.

## 2018-01-11 NOTE — Telephone Encounter (Signed)
Patient notified.  She said that she had thrown the tube away.  She stated it was about a year ago that she had the cream, so the pharmacy does not go back that far.

## 2018-01-26 ENCOUNTER — Other Ambulatory Visit: Payer: Self-pay | Admitting: Family Medicine

## 2018-01-26 DIAGNOSIS — K219 Gastro-esophageal reflux disease without esophagitis: Secondary | ICD-10-CM

## 2018-02-02 ENCOUNTER — Other Ambulatory Visit: Payer: Self-pay | Admitting: Family Medicine

## 2018-02-02 DIAGNOSIS — K219 Gastro-esophageal reflux disease without esophagitis: Secondary | ICD-10-CM

## 2018-02-03 ENCOUNTER — Telehealth: Payer: Self-pay | Admitting: *Deleted

## 2018-02-03 NOTE — Telephone Encounter (Signed)
Received Electronic Health Record from [facility] 01/08

## 2018-02-12 ENCOUNTER — Encounter: Payer: Self-pay | Admitting: Gastroenterology

## 2018-02-24 ENCOUNTER — Encounter: Payer: Self-pay | Admitting: Gastroenterology

## 2018-02-24 ENCOUNTER — Ambulatory Visit (AMBULATORY_SURGERY_CENTER): Payer: Self-pay | Admitting: *Deleted

## 2018-02-24 VITALS — Ht 65.5 in | Wt 146.0 lb

## 2018-02-24 DIAGNOSIS — Z86010 Personal history of colonic polyps: Secondary | ICD-10-CM

## 2018-02-24 MED ORDER — NA SULFATE-K SULFATE-MG SULF 17.5-3.13-1.6 GM/177ML PO SOLN: ORAL | 0 refills | Status: DC

## 2018-02-24 NOTE — Progress Notes (Signed)
Patient denies any allergies to eggs or soy. Patient denies any problems with anesthesia/sedation. Patient denies any oxygen use at home. Patient denies taking any diet/weight loss medications or blood thinners. EMMI education declined by pt.  

## 2018-03-10 ENCOUNTER — Ambulatory Visit (AMBULATORY_SURGERY_CENTER): Payer: PRIVATE HEALTH INSURANCE | Admitting: Gastroenterology

## 2018-03-10 ENCOUNTER — Encounter: Payer: Self-pay | Admitting: Gastroenterology

## 2018-03-10 VITALS — BP 134/86 | HR 51 | Temp 98.6°F | Resp 16 | Ht 65.5 in | Wt 146.0 lb

## 2018-03-10 DIAGNOSIS — Z86010 Personal history of colonic polyps: Secondary | ICD-10-CM | POA: Diagnosis not present

## 2018-03-10 MED ORDER — SODIUM CHLORIDE 0.9 % IV SOLN: 500.0000 mL | Freq: Once | INTRAVENOUS | Status: DC

## 2018-03-10 NOTE — Op Note (Signed)
[facility] Patient Name: Shelly Mcgee Procedure Date: 03/10/2018 8:25 AM MRN: [id number] Endoscopist: Ladene Artist , MD Age: 58 Referring MD:  Date of Birth: 03-May-1960 Gender: Female Account #: [id number] Procedure:                Colonoscopy Indications:              Surveillance: Personal history of adenomatous                            polyps on last colonoscopy > 5 years ago Medicines:                Monitored Anesthesia Care Procedure:                Pre-Anesthesia Assessment:                           - Prior to the procedure, a History and Physical                            was performed, and patient medications and                            allergies were reviewed. The patient's tolerance of                            previous anesthesia was also reviewed. The risks                            and benefits of the procedure and the sedation                            options and risks were discussed with the patient.                            All questions were answered, and informed consent                            was obtained. Prior Anticoagulants: The patient has                            taken no previous anticoagulant or antiplatelet                            agents. ASA Grade Assessment: II - A patient with                            mild systemic disease. After reviewing the risks                            and benefits, the patient was deemed in                            satisfactory condition to undergo the procedure.  After obtaining informed consent, the colonoscope                            was passed under direct vision. Throughout the                            procedure, the patient's blood pressure, pulse, and                            oxygen saturations were monitored continuously. The                            Colonoscope was introduced through the anus and                            advanced to the the cecum,  identified by                            appendiceal orifice and ileocecal valve. The                            ileocecal valve, appendiceal orifice, and rectum                            were photographed. The quality of the bowel                            preparation was excellent. The colonoscopy was                            performed without difficulty. The patient tolerated                            the procedure well. Scope In: 8:34:53 AM Scope Out: 8:52:05 AM Scope Withdrawal Time: 0 hours 12 minutes 35 seconds  Total Procedure Duration: 0 hours 17 minutes 12 seconds  Findings:                 The perianal and digital rectal examinations were                            normal.                           Internal hemorrhoids were found during                            retroflexion. The hemorrhoids were small and Grade                            I (internal hemorrhoids that do not prolapse).                           The exam was otherwise without abnormality on  direct and retroflexion views. Complications:            No immediate complications. Estimated blood loss:                            None. Estimated Blood Loss:     Estimated blood loss: none. Impression:               - Internal hemorrhoids.                           - The examination was otherwise normal on direct                            and retroflexion views.                           - No specimens collected. Recommendation:           - Repeat colonoscopy in 5 years for surveillance.                           - Patient has a contact number available for                            emergencies. The signs and symptoms of potential                            delayed complications were discussed with the                            patient. Return to normal activities tomorrow.                            Written discharge instructions were provided to the                             patient.                           - Resume previous diet.                           - Continue present medications. Ladene Artist, MD 03/10/2018 8:54:11 AM This report has been signed electronically.

## 2018-03-10 NOTE — Progress Notes (Signed)
Report given to PACU, vss 

## 2018-03-10 NOTE — Patient Instructions (Signed)
Information on hemorrhoids given to you today.  Repeat colonoscopy in 5 years.  YOU HAD AN ENDOSCOPIC PROCEDURE TODAY AT [facility]:   Refer to the procedure report that was given to you for any specific questions about what was found during the examination.  If the procedure report does not answer your questions, please call your gastroenterologist to clarify.  If you requested that your care partner not be given the details of your procedure findings, then the procedure report has been included in a sealed envelope for you to review at your convenience later.  YOU SHOULD EXPECT: Some feelings of bloating in the abdomen. Passage of more gas than usual.  Walking can help get rid of the air that was put into your GI tract during the procedure and reduce the bloating. If you had a lower endoscopy (such as a colonoscopy or flexible sigmoidoscopy) you may notice spotting of blood in your stool or on the toilet paper. If you underwent a bowel prep for your procedure, you may not have a normal bowel movement for a few days.  Please Note:  You might notice some irritation and congestion in your nose or some drainage.  This is from the oxygen used during your procedure.  There is no need for concern and it should clear up in a day or so.  SYMPTOMS TO REPORT IMMEDIATELY:   Following lower endoscopy (colonoscopy or flexible sigmoidoscopy):  Excessive amounts of blood in the stool  Significant tenderness or worsening of abdominal pains  Swelling of the abdomen that is new, acute  Fever of 100F or higher   For urgent or emergent issues, a gastroenterologist can be reached at any hour by calling [phone].   DIET:  We do recommend a small meal at first, but then you may proceed to your regular diet.  Drink plenty of fluids but you should avoid alcoholic beverages for 24 hours.  ACTIVITY:  You should plan to take it easy for the rest of today and you should NOT DRIVE or use heavy  machinery until tomorrow (because of the sedation medicines used during the test).    FOLLOW UP: Our staff will call the number listed on your records the next business day following your procedure to check on you and address any questions or concerns that you may have regarding the information given to you following your procedure. If we do not reach you, we will leave a message.  However, if you are feeling well and you are not experiencing any problems, there is no need to return our call.  We will assume that you have returned to your regular daily activities without incident.  If any biopsies were taken you will be contacted by phone or by letter within the next 1-3 weeks.  Please call us at [phone] if you have not heard about the biopsies in 3 weeks.    SIGNATURES/CONFIDENTIALITY: You and/or your care partner have signed paperwork which will be entered into your electronic medical record.  These signatures attest to the fact that that the information above on your After Visit Summary has been reviewed and is understood.  Full responsibility of the confidentiality of this discharge information lies with you and/or your care-partner.

## 2018-03-10 NOTE — Progress Notes (Signed)
Pt's states no medical or surgical changes since previsit or office visit. 

## 2018-03-11 ENCOUNTER — Telehealth: Payer: Self-pay | Admitting: *Deleted

## 2018-03-11 NOTE — Telephone Encounter (Signed)
  Follow up Call-  Call back number 03/10/2018  Post procedure Call Back phone  # [phone]  Permission to leave phone message Yes  Some recent data might be hidden     Patient questions:  Do you have a fever, pain , or abdominal swelling? No. Pain Score  0 *  Have you tolerated food without any problems? Yes.    Have you been able to return to your normal activities? Yes.    Do you have any questions about your discharge instructions: Diet   No. Medications  No. Follow up visit  No.  Do you have questions or concerns about your Care? No.  Actions: * If pain score is 4 or above: No action needed, pain <4.

## 2018-10-11 ENCOUNTER — Telehealth: Payer: Self-pay

## 2018-10-11 NOTE — Telephone Encounter (Signed)
Fine with me

## 2018-10-11 NOTE — Telephone Encounter (Signed)
Copied from [place] [id number]. Topic: Appointment Scheduling - Scheduling Inquiry for Clinic >> Oct 11, 2018  9:48 AM Scherrie Gerlach wrote: Reason for CRM:  pt would like to switch to Dr Bryan Lemma from Dr Etter Sjogren.  Pt states she lives right her at the [place] office and it is more convenient. Is that ok with you all?

## 2018-10-11 NOTE — Telephone Encounter (Signed)
Okay with Dr. Etter Sjogren

## 2018-10-13 NOTE — Telephone Encounter (Signed)
Tried to call pt to set up appt but she said she was driving and would give [place] a call back to schedule.

## 2018-10-13 NOTE — Telephone Encounter (Signed)
Ok with me 

## 2018-10-13 NOTE — Telephone Encounter (Signed)
Can you guys helps set this up, Thanks!

## 2018-10-13 NOTE — Telephone Encounter (Signed)
Appt scheduled 10/21/2018.

## 2018-10-21 ENCOUNTER — Encounter: Payer: PRIVATE HEALTH INSURANCE | Admitting: Family Medicine

## 2018-10-27 ENCOUNTER — Ambulatory Visit: Payer: PRIVATE HEALTH INSURANCE | Admitting: Family Medicine

## 2018-10-27 ENCOUNTER — Other Ambulatory Visit: Payer: Self-pay

## 2018-10-27 ENCOUNTER — Encounter: Payer: Self-pay | Admitting: Family Medicine

## 2018-10-27 VITALS — BP 120/85 | HR 60 | Wt 148.6 lb

## 2018-10-27 DIAGNOSIS — R05 Cough: Secondary | ICD-10-CM

## 2018-10-27 DIAGNOSIS — R053 Chronic cough: Secondary | ICD-10-CM

## 2018-10-27 DIAGNOSIS — Z2821 Immunization not carried out because of patient refusal: Secondary | ICD-10-CM

## 2018-10-27 DIAGNOSIS — Z7689 Persons encountering health services in other specified circumstances: Secondary | ICD-10-CM

## 2018-10-27 NOTE — Progress Notes (Signed)
Shelly Mcgee is a 58 y.o. female  Chief Complaint  Patient presents with  . Establish Care    HPI: Shelly Mcgee is a 58 y.o. female here to transfer care to our office. Previous PCP Dr. Carollee Herter. She works at [employer]. Pt will get flu shot at work.   She has chronic cough - managed on singulair, pepcid - x years. Stable, no change from baseline.   Last CPE, labs: 12/2017 Last PAP: 12/2017 - normal, pt has yearly d/t abnormal result a few years ago Last mammo: 01/2017 - normal - pt would like to do mammo every other, no fam h/o breast cancer Last colonoscopy: 02/2018 (Dr. Fuller Plan [facility]) - due in 02/2023  Med refills needed today: none   Past Medical History:  Diagnosis Date  . Allergy   . Anxiety   . Asthma   . Cervical dysplasia   . Endometriosis     Past Surgical History:  Procedure Laterality Date  . CESAREAN SECTION    . COLONOSCOPY    . COLPOSCOPY  1994  . DILATION AND CURETTAGE OF UTERUS    . ENDOMETRIAL ABLATION    . GYNECOLOGIC CRYOSURGERY    . LASER ABLATION OF THE CERVIX    . PELVIC LAPAROSCOPY     DL laser endometriosis  . POLYPECTOMY      Social History   Socioeconomic History  . Marital status: Married    Spouse name: Not on file  . Number of children: Not on file  . Years of education: Not on file  . Highest education level: Not on file  Occupational History  . Occupation: [employer]    Employer: [employer]    Comment: 12 hour shift  Social Needs  . Financial resource strain: Not on file  . Food insecurity    Worry: Not on file    Inability: Not on file  . Transportation needs    Medical: Not on file    Non-medical: Not on file  Tobacco Use  . Smoking status: Former Smoker    Packs/day: 0.50    Years: 6.00    Pack years: 3.00    Quit date: 08/04/1982    Years since quitting: 36.2  . Smokeless tobacco: Never Used  Substance and Sexual Activity  . Alcohol use: Yes    Alcohol/week: 5.0 standard  drinks    Types: 5 Glasses of wine per week  . Drug use: No  . Sexual activity: Yes    Partners: Male    Birth control/protection: Surgical    Comment: had ablation  Lifestyle  . Physical activity    Days per week: Not on file    Minutes per session: Not on file  . Stress: Not on file  Relationships  . Social Herbalist on phone: Not on file    Gets together: Not on file    Attends religious service: Not on file    Active member of club or organization: Not on file    Attends meetings of clubs or organizations: Not on file    Relationship status: Not on file  . Intimate partner violence    Fear of current or ex partner: Not on file    Emotionally abused: Not on file    Physically abused: Not on file    Forced sexual activity: Not on file  Other Topics Concern  . Not on file  Social History Narrative   Exercise--  cycling    Family History  Problem Relation Age of Onset  . Diabetes Mother   . Hypertension Mother   . Stomach cancer Mother   . Diabetes Father   . Diabetes Brother   . Diabetes Maternal Grandmother   . Diabetes Maternal Aunt   . Colon cancer Maternal Aunt 80  . Diabetes Maternal Uncle   . Thyroid disease Sister   . Personality disorder Sister   . Thyroid disease Paternal Grandmother   . Esophageal cancer Neg Hx   . Rectal cancer Neg Hx   . Colon polyps Neg Hx      Immunization History  Administered Date(s) Administered  . Influenza,inj,Quad PF,6+ Mos 11/23/2015  . Influenza-Unspecified 10/27/2017    Outpatient Encounter Medications as of 10/27/2018  Medication Sig Note  . Ascorbic Acid (VITAMIN C) 1000 MG tablet Take 2,000 mg by mouth daily.   Marland Kitchen EPINEPHrine (EPIPEN) 0.3 mg/0.3 mL IJ SOAJ injection Inject 0.3 mLs (0.3 mg total) into the muscle once.   . famotidine (PEPCID) 20 MG tablet TAKE 1 TABLET BY MOUTH TWICE DAILY AS NEEDED FOR HEARTBURN AND INDIGESTION   . glucosamine-chondroitin 500-400 MG tablet Take 1 tablet by mouth daily.    . IBUPROFEN PO Take by mouth. 09/25/2014: PRN  . montelukast (SINGULAIR) 10 MG tablet 1 po qd   . Multiple Vitamins-Minerals (MULTIVITAMIN WITH MINERALS) tablet Take 1 tablet by mouth daily.   . Sodium Chloride-Sodium Bicarb (AYR SALINE NASAL RINSE NA) Place into the nose as needed.    No facility-administered encounter medications on file as of 10/27/2018.      ROS: Gen: no fever, chills  Skin: no rash, itching ENT: no ear pain, ear drainage, nasal congestion, rhinorrhea, sinus pressure, sore throat Eyes: no blurry vision, double vision Resp: + chronic cough,no wheeze,SOB CV: no CP, palpitations, LE edema,  GI: no heartburn, n/v/d/c, abd pain GU: no dysuria, urgency, frequency, hematuria  MSK: no joint pain, myalgias; chronic low back pain Neuro: no dizziness, headache, weakness Psych: no depression, anxiety, insomnia   Allergies  Allergen Reactions  . Wasp Venom   . Yellow Jacket Venom [Bee Venom]     BP 120/85   Pulse 60   Wt 148 lb 9.6 oz (67.4 kg)   SpO2 96%   BMI 24.35 kg/m   Physical Exam  Constitutional: She is oriented to person, place, and time. She appears well-developed and well-nourished. No distress.  Cardiovascular: Normal rate, regular rhythm and normal heart sounds.  Pulmonary/Chest: Effort normal and breath sounds normal. No respiratory distress. She has no wheezes.  Musculoskeletal:        General: No edema.  Neurological: She is alert and oriented to person, place, and time.  Psychiatric: She has a normal mood and affect. Her behavior is normal.     A/P:  1. Encounter to establish care with new doctor - due for CPE, fasting labs, PAP in 12/2018  2. Influenza vaccination declined by patient - pt will get at work  3. Chronic cough - x years, stable, at baseline - cont pepcid, singular - f/u PRN

## 2019-01-13 ENCOUNTER — Other Ambulatory Visit: Payer: Self-pay | Admitting: Family Medicine

## 2019-01-13 DIAGNOSIS — J301 Allergic rhinitis due to pollen: Secondary | ICD-10-CM

## 2019-01-14 ENCOUNTER — Other Ambulatory Visit: Payer: Self-pay | Admitting: Family Medicine

## 2019-01-14 DIAGNOSIS — J301 Allergic rhinitis due to pollen: Secondary | ICD-10-CM

## 2019-01-18 ENCOUNTER — Other Ambulatory Visit: Payer: Self-pay | Admitting: Family Medicine

## 2019-01-18 DIAGNOSIS — J301 Allergic rhinitis due to pollen: Secondary | ICD-10-CM

## 2019-02-10 ENCOUNTER — Other Ambulatory Visit: Payer: Self-pay

## 2019-02-11 ENCOUNTER — Ambulatory Visit (INDEPENDENT_AMBULATORY_CARE_PROVIDER_SITE_OTHER): Payer: Managed Care, Other (non HMO) | Admitting: Family Medicine

## 2019-02-11 ENCOUNTER — Encounter: Payer: Self-pay | Admitting: Family Medicine

## 2019-02-11 ENCOUNTER — Other Ambulatory Visit (HOSPITAL_COMMUNITY)
Admission: RE | Admit: 2019-02-11 | Discharge: 2019-02-11 | Disposition: A | Discharge status: Home / Self Care | Payer: Managed Care, Other (non HMO) | Source: Ambulatory Visit | Attending: Family Medicine | Admitting: Family Medicine

## 2019-02-11 VITALS — BP 112/80 | Temp 96.6°F | Ht 65.5 in | Wt 150.6 lb

## 2019-02-11 DIAGNOSIS — Z0000 Encounter for general adult medical examination without abnormal findings: Secondary | ICD-10-CM | POA: Diagnosis not present

## 2019-02-11 DIAGNOSIS — K219 Gastro-esophageal reflux disease without esophagitis: Secondary | ICD-10-CM | POA: Diagnosis not present

## 2019-02-11 DIAGNOSIS — Z1231 Encounter for screening mammogram for malignant neoplasm of breast: Secondary | ICD-10-CM | POA: Diagnosis not present

## 2019-02-11 DIAGNOSIS — J301 Allergic rhinitis due to pollen: Secondary | ICD-10-CM

## 2019-02-11 DIAGNOSIS — Z124 Encounter for screening for malignant neoplasm of cervix: Secondary | ICD-10-CM | POA: Insufficient documentation

## 2019-02-11 LAB — BASIC METABOLIC PANEL
BUN: 19 mg/dL (ref 6–23)
CO2: 26 mEq/L (ref 19–32)
Calcium: 9.5 mg/dL (ref 8.4–10.5)
Chloride: 104 mEq/L (ref 96–112)
Creatinine, Ser: 0.69 mg/dL (ref 0.40–1.20)
GFR: 87.58 mL/min (ref >60.00)
Glucose, Bld: 94 mg/dL (ref 70–99)
Potassium: 3.9 mEq/L (ref 3.5–5.1)
Sodium: 139 mEq/L (ref 135–145)

## 2019-02-11 LAB — LIPID PANEL
Cholesterol: 206 mg/dL — ABNORMAL HIGH (ref 0–200)
HDL: 77.3 mg/dL (ref >39.00)
LDL Cholesterol: 117 mg/dL — ABNORMAL HIGH (ref 0–99)
NonHDL: 128.21
Total CHOL/HDL Ratio: 3
Triglycerides: 54 mg/dL (ref 0.0–149.0)
VLDL: 10.8 mg/dL (ref 0.0–40.0)

## 2019-02-11 LAB — ALT: ALT: 17 U/L (ref 0–35)

## 2019-02-11 LAB — VITAMIN D 25 HYDROXY (VIT D DEFICIENCY, FRACTURES): VITD: 29.05 ng/mL — ABNORMAL LOW (ref 30.00–100.00)

## 2019-02-11 LAB — AST: AST: 21 U/L (ref 0–37)

## 2019-02-11 MED ORDER — FAMOTIDINE 20 MG PO TABS: 20.0000 mg | ORAL_TABLET | Freq: Every day | ORAL | 3 refills | Status: DC

## 2019-02-11 MED ORDER — MONTELUKAST SODIUM 10 MG PO TABS: ORAL_TABLET | ORAL | 3 refills | Status: DC

## 2019-02-11 NOTE — Progress Notes (Signed)
Shelly Mcgee is a 59 y.o. female  Chief Complaint  Patient presents with  . Annual Exam    Patient has no complaints and is fasting for labs today    HPI: Shelly Mcgee is a 59 y.o. female here for annual CPE, fasting labs.   Last PAP: 12/2017 - normal and HPV neg; prior to that 11/2016 - normal, HPV neg; pt has been doing annual PAP d/t remote h/o cervical dysplasia and she would like to do PAP today Last mammo: 01/2017 Last colonoscopy: 02/2018 - Dr. Fuller Plan w/ [facility] - due for f/u in 2025  Diet/Exercise: walks an hr a few days per week; diet healthy Dental: UTD Vision: due for exam  Med refills needed today? Yes - per orders   Past Medical History:  Diagnosis Date  . Allergy   . Anxiety   . Asthma   . Cervical dysplasia   . Endometriosis     Past Surgical History:  Procedure Laterality Date  . CESAREAN SECTION    . COLONOSCOPY    . COLPOSCOPY  1994  . DILATION AND CURETTAGE OF UTERUS    . ENDOMETRIAL ABLATION    . GYNECOLOGIC CRYOSURGERY    . LASER ABLATION OF THE CERVIX    . PELVIC LAPAROSCOPY     DL laser endometriosis  . POLYPECTOMY      Social History   Socioeconomic History  . Marital status: Married    Spouse name: Not on file  . Number of children: Not on file  . Years of education: Not on file  . Highest education level: Not on file  Occupational History  . Occupation: [employer]    Employer: [employer]    Comment: 12 hour shift  Tobacco Use  . Smoking status: Former Smoker    Packs/day: 0.50    Years: 6.00    Pack years: 3.00    Quit date: 08/04/1982    Years since quitting: 36.5  . Smokeless tobacco: Never Used  Substance and Sexual Activity  . Alcohol use: Yes    Alcohol/week: 5.0 standard drinks    Types: 5 Glasses of wine per week  . Drug use: No  . Sexual activity: Yes    Partners: Male    Birth control/protection: Surgical    Comment: had ablation  Other Topics Concern  . Not on file  Social  History Narrative   Exercise-- cycling   Social Determinants of Health   Financial Resource Strain:   . Difficulty of Paying Living Expenses: Not on file  Food Insecurity:   . Worried About Charity fundraiser in the Last Year: Not on file  . Ran Out of Food in the Last Year: Not on file  Transportation Needs:   . Lack of Transportation (Medical): Not on file  . Lack of Transportation (Non-Medical): Not on file  Physical Activity:   . Days of Exercise per Week: Not on file  . Minutes of Exercise per Session: Not on file  Stress:   . Feeling of Stress : Not on file  Social Connections:   . Frequency of Communication with Friends and Family: Not on file  . Frequency of Social Gatherings with Friends and Family: Not on file  . Attends Religious Services: Not on file  . Active Member of Clubs or Organizations: Not on file  . Attends Archivist Meetings: Not on file  . Marital Status: Not on file  Intimate Partner Violence:   .  Fear of Current or Ex-Partner: Not on file  . Emotionally Abused: Not on file  . Physically Abused: Not on file  . Sexually Abused: Not on file    Family History  Problem Relation Age of Onset  . Diabetes Mother   . Hypertension Mother   . Stomach cancer Mother   . Diabetes Father   . Diabetes Brother   . Diabetes Maternal Grandmother   . Diabetes Maternal Aunt   . Colon cancer Maternal Aunt 80  . Diabetes Maternal Uncle   . Thyroid disease Sister   . Personality disorder Sister   . Thyroid disease Paternal Grandmother   . Esophageal cancer Neg Hx   . Rectal cancer Neg Hx   . Colon polyps Neg Hx      Immunization History  Administered Date(s) Administered  . Influenza,inj,Quad PF,6+ Mos 11/23/2015, 11/26/2018  . Influenza-Unspecified 10/27/2017    Outpatient Encounter Medications as of 02/11/2019  Medication Sig Note  . Ascorbic Acid (VITAMIN C) 1000 MG tablet Take 2,000 mg by mouth daily.   Marland Kitchen EPINEPHrine (EPIPEN) 0.3 mg/0.3 mL  IJ SOAJ injection Inject 0.3 mLs (0.3 mg total) into the muscle once.   . famotidine (PEPCID) 20 MG tablet Take 1 tablet (20 mg total) by mouth daily.   Marland Kitchen glucosamine-chondroitin 500-400 MG tablet Take 1 tablet by mouth daily.   . IBUPROFEN PO Take by mouth. 09/25/2014: PRN  . montelukast (SINGULAIR) 10 MG tablet Take 1 tablet by mouth once daily   . Multiple Vitamins-Minerals (MULTIVITAMIN WITH MINERALS) tablet Take 1 tablet by mouth daily.   . Sodium Chloride-Sodium Bicarb (AYR SALINE NASAL RINSE NA) Place into the nose as needed.   . [DISCONTINUED] famotidine (PEPCID) 20 MG tablet TAKE 1 TABLET BY MOUTH TWICE DAILY AS NEEDED FOR HEARTBURN AND INDIGESTION   . [DISCONTINUED] montelukast (SINGULAIR) 10 MG tablet Take 1 tablet by mouth once daily    No facility-administered encounter medications on file as of 02/11/2019.     ROS: Gen: no fever, chills  Skin: no rash, itching ENT: no ear pain, ear drainage, nasal congestion, rhinorrhea, sinus pressure, sore throat Eyes: no blurry vision, double vision Resp: no cough, wheeze,SOB Breast: no breast tenderness, no nipple discharge, no breast masses CV: no CP, palpitations, LE edema,  GI: no heartburn, n/v/d/c, abd pain GU: no dysuria, urgency, frequency, hematuria; no vaginal itching, odor, discharge MSK: no joint pain, myalgias, back pain Neuro: no dizziness, headache, weakness, vertigo Psych: no depression, anxiety, insomnia   Allergies  Allergen Reactions  . Wasp Venom   . Yellow Jacket Venom [Bee Venom]     BP 112/80 (BP Location: Left Arm, Patient Position: Sitting, Cuff Size: Normal)   Temp (!) 96.6 F (35.9 C) (Temporal)   Ht 5' 5.5" (1.664 m)   Wt 150 lb 9.6 oz (68.3 kg)   BMI 24.68 kg/m   Pulse Readings from Last 3 Encounters:  10/27/18 60  03/10/18 (!) 51  01/04/18 (!) 54     Physical Exam  Constitutional: She is oriented to person, place, and time. She appears well-developed and well-nourished. No distress.    HENT:  Head: Normocephalic and atraumatic.  Right Ear: Tympanic membrane and ear canal normal.  Left Ear: Tympanic membrane and ear canal normal.  Nose: Nose normal.  Mouth/Throat: Oropharynx is clear and moist and mucous membranes are normal.  Eyes: Pupils are equal, round, and reactive to light. Conjunctivae are normal.  Neck: No thyromegaly present.  Cardiovascular: Normal rate, regular rhythm,  normal heart sounds and intact distal pulses.  No murmur heard. Pulmonary/Chest: Effort normal and breath sounds normal. No respiratory distress. She has no wheezes. She has no rhonchi.  Abdominal: Soft. Bowel sounds are normal. She exhibits no distension and no mass. There is no abdominal tenderness.  Genitourinary:    Vagina and uterus normal.  There is no rash, tenderness or lesion on the right labia. There is no rash, tenderness or lesion on the left labia. Cervix exhibits no motion tenderness, no discharge and no friability. Right adnexum displays no mass, no tenderness and no fullness. Left adnexum displays no mass, no tenderness and no fullness.  Musculoskeletal:        General: No edema.     Cervical back: Neck supple.  Lymphadenopathy:    She has no cervical adenopathy.  Neurological: She is alert and oriented to person, place, and time. She exhibits normal muscle tone. Coordination normal.  Skin: Skin is warm and dry.  Psychiatric: She has a normal mood and affect. Her behavior is normal.     A/P:  1. Annual physical exam - colonoscopy UTD (due in 2025), due for mammo and pt would like annual PAP d/t remote h/o cervical dysplasia - immunizations UTD - discussed importance of regular CV exercise, healthy diet, adequate sleep - dental exam UTD; due for vision exam and pt will schedule - ALT - AST - Basic metabolic panel - Lipid panel - VITAMIN D 25 Hydroxy (Vit-D Deficiency, Fractures) - next CPE in 1 year  2. Encounter for screening mammogram for malignant neoplasm of  breast - MM DIGITAL SCREENING BILATERAL; Future  3. Chronic seasonal allergic rhinitis due to pollen - stable, at baseline Refill: - montelukast (SINGULAIR) 10 MG tablet; Take 1 tablet by mouth once daily  Dispense: 90 tablet; Refill: 3  4. Gastroesophageal reflux disease - stable, controlled Refill: - famotidine (PEPCID) 20 MG tablet; Take 1 tablet (20 mg total) by mouth daily.  Dispense: 90 tablet; Refill: 3  5. Screening for cervical cancer - Cytology - PAP( [place])  This visit occurred during the SARS-CoV-2 public health emergency.  Safety protocols were in place, including screening questions prior to the visit, additional usage of staff PPE, and extensive cleaning of exam room while observing appropriate contact time as indicated for disinfecting solutions.

## 2019-02-11 NOTE — Patient Instructions (Signed)
Health Maintenance, Female Adopting a healthy lifestyle and getting preventive care are important in promoting health and wellness. Ask your health care provider about:  The right schedule for you to have regular tests and exams.  Things you can do on your own to prevent diseases and keep yourself healthy. What should I know about diet, weight, and exercise? Eat a healthy diet   Eat a diet that includes plenty of vegetables, fruits, low-fat dairy products, and lean protein.  Do not eat a lot of foods that are high in solid fats, added sugars, or sodium. Maintain a healthy weight Body mass index (BMI) is used to identify weight problems. It estimates body fat based on height and weight. Your health care provider can help determine your BMI and help you achieve or maintain a healthy weight. Get regular exercise Get regular exercise. This is one of the most important things you can do for your health. Most adults should:  Exercise for at least 150 minutes each week. The exercise should increase your heart rate and make you sweat (moderate-intensity exercise).  Do strengthening exercises at least twice a week. This is in addition to the moderate-intensity exercise.  Spend less time sitting. Even light physical activity can be beneficial. Watch cholesterol and blood lipids Have your blood tested for lipids and cholesterol at 58 years of age, then have this test every 5 years. Have your cholesterol levels checked more often if:  Your lipid or cholesterol levels are high.  You are older than 58 years of age.  You are at high risk for heart disease. What should I know about cancer screening? Depending on your health history and family history, you may need to have cancer screening at various ages. This may include screening for:  Breast cancer.  Cervical cancer.  Colorectal cancer.  Skin cancer.  Lung cancer. What should I know about heart disease, diabetes, and high blood  pressure? Blood pressure and heart disease  High blood pressure causes heart disease and increases the risk of stroke. This is more likely to develop in people who have high blood pressure readings, are of African descent, or are overweight.  Have your blood pressure checked: ? Every 3-5 years if you are 18-39 years of age. ? Every year if you are 40 years old or older. Diabetes Have regular diabetes screenings. This checks your fasting blood sugar level. Have the screening done:  Once every three years after age 40 if you are at a normal weight and have a low risk for diabetes.  More often and at a younger age if you are overweight or have a high risk for diabetes. What should I know about preventing infection? Hepatitis B If you have a higher risk for hepatitis B, you should be screened for this virus. Talk with your health care provider to find out if you are at risk for hepatitis B infection. Hepatitis C Testing is recommended for:  Everyone born from 1945 through 1965.  Anyone with known risk factors for hepatitis C. Sexually transmitted infections (STIs)  Get screened for STIs, including gonorrhea and chlamydia, if: ? You are sexually active and are younger than 58 years of age. ? You are older than 58 years of age and your health care provider tells you that you are at risk for this type of infection. ? Your sexual activity has changed since you were last screened, and you are at increased risk for chlamydia or gonorrhea. Ask your health care provider if   you are at risk.  Ask your health care provider about whether you are at high risk for HIV. Your health care provider may recommend a prescription medicine to help prevent HIV infection. If you choose to take medicine to prevent HIV, you should first get tested for HIV. You should then be tested every 3 months for as long as you are taking the medicine. Pregnancy  If you are about to stop having your period (premenopausal) and  you may become pregnant, seek counseling before you get pregnant.  Take 400 to 800 micrograms (mcg) of folic acid every day if you become pregnant.  Ask for birth control (contraception) if you want to prevent pregnancy. Osteoporosis and menopause Osteoporosis is a disease in which the bones lose minerals and strength with aging. This can result in bone fractures. If you are 65 years old or older, or if you are at risk for osteoporosis and fractures, ask your health care provider if you should:  Be screened for bone loss.  Take a calcium or vitamin D supplement to lower your risk of fractures.  Be given hormone replacement therapy (HRT) to treat symptoms of menopause. Follow these instructions at home: Lifestyle  Do not use any products that contain nicotine or tobacco, such as cigarettes, e-cigarettes, and chewing tobacco. If you need help quitting, ask your health care provider.  Do not use street drugs.  Do not share needles.  Ask your health care provider for help if you need support or information about quitting drugs. Alcohol use  Do not drink alcohol if: ? Your health care provider tells you not to drink. ? You are pregnant, may be pregnant, or are planning to become pregnant.  If you drink alcohol: ? Limit how much you use to 0-1 drink a day. ? Limit intake if you are breastfeeding.  Be aware of how much alcohol is in your drink. In the [place] one drink equals one 12 oz bottle of beer (355 mL), one 5 oz glass of wine (148 mL), or one 1 oz glass of hard liquor (44 mL). General instructions  Schedule regular health, dental, and eye exams.  Stay current with your vaccines.  Tell your health care provider if: ? You often feel depressed. ? You have ever been abused or do not feel safe at home. Summary  Adopting a healthy lifestyle and getting preventive care are important in promoting health and wellness.  Follow your health care provider's instructions about healthy  diet, exercising, and getting tested or screened for diseases.  Follow your health care provider's instructions on monitoring your cholesterol and blood pressure. This information is not intended to replace advice given to you by your health care provider. Make sure you discuss any questions you have with your health care provider. Document Revised: 01/06/2018 Document Reviewed: 01/06/2018 Elsevier Patient Education  2020 Elsevier Inc.  

## 2019-02-13 ENCOUNTER — Encounter: Payer: Self-pay | Admitting: Family Medicine

## 2019-02-14 ENCOUNTER — Other Ambulatory Visit: Payer: Self-pay | Admitting: Family Medicine

## 2019-02-14 ENCOUNTER — Encounter: Payer: Self-pay | Admitting: Family Medicine

## 2019-02-14 DIAGNOSIS — J301 Allergic rhinitis due to pollen: Secondary | ICD-10-CM

## 2019-02-14 LAB — CYTOLOGY - PAP
Comment: NEGATIVE
Diagnosis: NEGATIVE
High risk HPV: NEGATIVE

## 2019-05-13 ENCOUNTER — Encounter: Payer: Self-pay | Admitting: Family Medicine

## 2019-05-13 ENCOUNTER — Other Ambulatory Visit: Payer: Self-pay | Admitting: Family Medicine

## 2019-05-13 DIAGNOSIS — R234 Changes in skin texture: Secondary | ICD-10-CM

## 2019-05-13 NOTE — Telephone Encounter (Signed)
Pt informed at time of phone conversation.

## 2019-05-13 NOTE — Telephone Encounter (Signed)
I spoke with pt and she said she will be seen on Monday.  Pt said if you still wanted to see her then to call her back.  Does she still need to schedule with you?

## 2019-05-16 ENCOUNTER — Other Ambulatory Visit: Payer: Self-pay

## 2019-05-16 ENCOUNTER — Other Ambulatory Visit: Payer: Self-pay | Admitting: Family Medicine

## 2019-05-16 ENCOUNTER — Ambulatory Visit
Admission: RE | Admit: 2019-05-16 | Discharge: 2019-05-16 | Disposition: A | Discharge status: Home / Self Care | Payer: Managed Care, Other (non HMO) | Source: Ambulatory Visit | Attending: Family Medicine | Admitting: Family Medicine

## 2019-05-16 DIAGNOSIS — R234 Changes in skin texture: Secondary | ICD-10-CM

## 2019-05-30 ENCOUNTER — Other Ambulatory Visit: Payer: Managed Care, Other (non HMO)

## 2019-06-07 ENCOUNTER — Encounter: Payer: Self-pay | Admitting: Family Medicine

## 2019-06-07 DIAGNOSIS — K219 Gastro-esophageal reflux disease without esophagitis: Secondary | ICD-10-CM

## 2019-06-07 MED ORDER — FAMOTIDINE 20 MG PO TABS: 20.0000 mg | ORAL_TABLET | Freq: Two times a day (BID) | ORAL | 3 refills | Status: DC

## 2019-06-22 ENCOUNTER — Other Ambulatory Visit: Payer: Managed Care, Other (non HMO)

## 2019-07-01 ENCOUNTER — Ambulatory Visit
Admission: RE | Admit: 2019-07-01 | Discharge: 2019-07-01 | Disposition: A | Discharge status: Home / Self Care | Payer: Managed Care, Other (non HMO) | Source: Ambulatory Visit | Attending: Family Medicine | Admitting: Family Medicine

## 2019-07-01 ENCOUNTER — Other Ambulatory Visit: Payer: Self-pay

## 2019-07-01 DIAGNOSIS — R234 Changes in skin texture: Secondary | ICD-10-CM

## 2019-08-18 ENCOUNTER — Encounter: Payer: Self-pay | Admitting: Family Medicine

## 2020-02-13 ENCOUNTER — Other Ambulatory Visit: Payer: Self-pay | Admitting: Family Medicine

## 2020-02-13 DIAGNOSIS — K219 Gastro-esophageal reflux disease without esophagitis: Secondary | ICD-10-CM

## 2020-02-13 NOTE — Telephone Encounter (Signed)
Patient last visit was January 2021. Pt is due for an annual. I did send in a 90 day supply for pt to get her to her next appointment.

## 2020-02-14 ENCOUNTER — Other Ambulatory Visit: Payer: Self-pay | Admitting: Family Medicine

## 2020-02-14 DIAGNOSIS — J301 Allergic rhinitis due to pollen: Secondary | ICD-10-CM

## 2020-02-16 ENCOUNTER — Encounter: Payer: Self-pay | Admitting: Family Medicine

## 2020-02-17 NOTE — Telephone Encounter (Signed)
Please advise on patient's message.  Thanks.  Dm/cma

## 2020-04-26 ENCOUNTER — Telehealth: Payer: Self-pay | Admitting: Family Medicine

## 2020-04-26 NOTE — Telephone Encounter (Signed)
What is the name of the medication? montelukast (SINGULAIR) 10 MG tablet [[id number]] and famotidine (PEPCID) 20 MG tablet [[id number]]    Have you contacted your pharmacy to request a refill? Pt has moved her cpe from May to July 7 because of a work conflict. She would like a curtesy refill to last her until her upcoming appointment.   Which pharmacy would you like this sent to? [facility] [place].  [address] [place] [address]  Phone:  [phone] Fax:  [phone]      Patient notified that their request is being sent to the clinical staff for review and that they should receive a call once it is complete. If they do not receive a call within 72 hours they can check with their pharmacy or our office.

## 2020-04-27 NOTE — Telephone Encounter (Signed)
I tried to schedule a med refill appointment and she said she does not want to come in and pay a copay.

## 2020-04-27 NOTE — Telephone Encounter (Signed)
Patient need a appointment before any other refills.  Please schedule appt for pt.  Thank you.

## 2020-05-13 ENCOUNTER — Other Ambulatory Visit: Payer: Self-pay | Admitting: Family Medicine

## 2020-05-13 DIAGNOSIS — J301 Allergic rhinitis due to pollen: Secondary | ICD-10-CM

## 2020-05-13 DIAGNOSIS — K219 Gastro-esophageal reflux disease without esophagitis: Secondary | ICD-10-CM

## 2020-06-09 ENCOUNTER — Other Ambulatory Visit: Payer: Self-pay | Admitting: Family Medicine

## 2020-06-09 DIAGNOSIS — K219 Gastro-esophageal reflux disease without esophagitis: Secondary | ICD-10-CM

## 2020-06-09 DIAGNOSIS — J301 Allergic rhinitis due to pollen: Secondary | ICD-10-CM

## 2020-06-13 ENCOUNTER — Other Ambulatory Visit: Payer: Managed Care, Other (non HMO)

## 2020-06-14 ENCOUNTER — Encounter: Payer: Managed Care, Other (non HMO) | Admitting: Family Medicine

## 2020-07-09 ENCOUNTER — Other Ambulatory Visit: Payer: Self-pay | Admitting: Family Medicine

## 2020-07-09 DIAGNOSIS — K219 Gastro-esophageal reflux disease without esophagitis: Secondary | ICD-10-CM

## 2020-07-09 DIAGNOSIS — J301 Allergic rhinitis due to pollen: Secondary | ICD-10-CM

## 2020-08-02 ENCOUNTER — Encounter: Payer: Managed Care, Other (non HMO) | Admitting: Family Medicine

## 2020-08-08 ENCOUNTER — Other Ambulatory Visit: Payer: Self-pay | Admitting: Family Medicine

## 2020-08-08 ENCOUNTER — Other Ambulatory Visit: Payer: Self-pay

## 2020-08-08 ENCOUNTER — Ambulatory Visit (INDEPENDENT_AMBULATORY_CARE_PROVIDER_SITE_OTHER): Payer: Managed Care, Other (non HMO) | Admitting: Family Medicine

## 2020-08-08 ENCOUNTER — Encounter: Payer: Self-pay | Admitting: Family Medicine

## 2020-08-08 VITALS — BP 110/82 | HR 55 | Temp 97.5°F | Ht 65.25 in | Wt 143.8 lb

## 2020-08-08 DIAGNOSIS — Z1329 Encounter for screening for other suspected endocrine disorder: Secondary | ICD-10-CM | POA: Diagnosis not present

## 2020-08-08 DIAGNOSIS — Z1321 Encounter for screening for nutritional disorder: Secondary | ICD-10-CM | POA: Diagnosis not present

## 2020-08-08 DIAGNOSIS — K219 Gastro-esophageal reflux disease without esophagitis: Secondary | ICD-10-CM

## 2020-08-08 DIAGNOSIS — Z0000 Encounter for general adult medical examination without abnormal findings: Secondary | ICD-10-CM

## 2020-08-08 DIAGNOSIS — Z13220 Encounter for screening for lipoid disorders: Secondary | ICD-10-CM | POA: Diagnosis not present

## 2020-08-08 DIAGNOSIS — J301 Allergic rhinitis due to pollen: Secondary | ICD-10-CM

## 2020-08-08 DIAGNOSIS — Z2821 Immunization not carried out because of patient refusal: Secondary | ICD-10-CM

## 2020-08-08 LAB — BASIC METABOLIC PANEL
BUN: 16 mg/dL (ref 6–23)
CO2: 29 mEq/L (ref 19–32)
Calcium: 9.6 mg/dL (ref 8.4–10.5)
Chloride: 105 mEq/L (ref 96–112)
Creatinine, Ser: 0.78 mg/dL (ref 0.40–1.20)
GFR: 83.47 mL/min (ref >60.00)
Glucose, Bld: 95 mg/dL (ref 70–99)
Potassium: 4.6 mEq/L (ref 3.5–5.1)
Sodium: 141 mEq/L (ref 135–145)

## 2020-08-08 LAB — ALT: ALT: 18 U/L (ref 0–35)

## 2020-08-08 LAB — AST: AST: 23 U/L (ref 0–37)

## 2020-08-08 LAB — LIPID PANEL
Cholesterol: 188 mg/dL (ref 0–200)
HDL: 72 mg/dL (ref >39.00)
LDL Cholesterol: 99 mg/dL (ref 0–99)
NonHDL: 116.02
Total CHOL/HDL Ratio: 3
Triglycerides: 85 mg/dL (ref 0.0–149.0)
VLDL: 17 mg/dL (ref 0.0–40.0)

## 2020-08-08 LAB — TSH: TSH: 1.68 u[IU]/mL (ref 0.35–5.50)

## 2020-08-08 LAB — VITAMIN D 25 HYDROXY (VIT D DEFICIENCY, FRACTURES): VITD: 50.14 ng/mL (ref 30.00–100.00)

## 2020-08-08 LAB — VITAMIN B12: Vitamin B-12: 528 pg/mL (ref 211–911)

## 2020-08-08 MED ORDER — MONTELUKAST SODIUM 10 MG PO TABS: 10.0000 mg | ORAL_TABLET | Freq: Every day | ORAL | 1 refills | Status: DC

## 2020-08-08 MED ORDER — FAMOTIDINE 20 MG PO TABS: 20.0000 mg | ORAL_TABLET | Freq: Two times a day (BID) | ORAL | 1 refills | Status: DC

## 2020-08-08 NOTE — Progress Notes (Signed)
Shelly Mcgee is a 60 y.o. female  Chief Complaint  Patient presents with   Annual Exam    Pt fasting for lab work.  Pt declined Tdap today and Shingrix vaccine.  Pt is UTD on colonoscopy, mammogram and pap smear, she has no concerns to address today.    HPI: Shelly Mcgee is a 60 y.o. female patient seen today for annual exam, fasting labs.   Last PAP: 01/2019 Last mammo: 06/2019 Last colonoscopy: 02/2018 - due in 02/2023. Dr/ Fuller Plan w/ [facility]  Diet/Exercise: overall healthy, well-balanced; walks dog daily and 30-40 miles per wk based on [employer] Dentist: UTD Vision: 1 year ago  Med refills needed today? no  Declines Tdap and shingles vaccines  Past Medical History:  Diagnosis Date   Allergy    Anxiety    Asthma    Cervical dysplasia    Endometriosis     Past Surgical History:  Procedure Laterality Date   CESAREAN SECTION     COLONOSCOPY     COLPOSCOPY  1994   DILATION AND CURETTAGE OF UTERUS     ENDOMETRIAL ABLATION     GYNECOLOGIC CRYOSURGERY     LASER ABLATION OF THE CERVIX     PELVIC LAPAROSCOPY     DL laser endometriosis   POLYPECTOMY      Social History   Socioeconomic History   Marital status: Married    Spouse name: Not on file   Number of children: Not on file   Years of education: Not on file   Highest education level: Not on file  Occupational History   Occupation: [employer]    Employer: [employer]    Comment: 12 hour shift  Tobacco Use   Smoking status: Former    Packs/day: 0.50    Years: 6.00    Pack years: 3.00    Types: Cigarettes    Quit date: 08/04/1982    Years since quitting: 38.0   Smokeless tobacco: Never  Vaping Use   Vaping Use: Never used  Substance and Sexual Activity   Alcohol use: Yes    Alcohol/week: 5.0 standard drinks    Types: 5 Glasses of wine per week   Drug use: No   Sexual activity: Yes    Partners: Male    Birth control/protection: Surgical    Comment: had ablation  Other Topics  Concern   Not on file  Social History Narrative   Exercise-- cycling   Social Determinants of Health   Financial Resource Strain: Not on file  Food Insecurity: Not on file  Transportation Needs: Not on file  Physical Activity: Not on file  Stress: Not on file  Social Connections: Not on file  Intimate Partner Violence: Not on file    Family History  Problem Relation Age of Onset   Diabetes Mother    Hypertension Mother    Stomach cancer Mother    Diabetes Father    Diabetes Brother    Diabetes Maternal Grandmother    Diabetes Maternal Aunt    Colon cancer Maternal Aunt 80   Diabetes Maternal Uncle    Thyroid disease Sister    Personality disorder Sister    Thyroid disease Paternal Grandmother    Esophageal cancer Neg Hx    Rectal cancer Neg Hx    Colon polyps Neg Hx      Immunization History  Administered Date(s) Administered   Influenza,inj,Quad PF,6+ Mos 11/23/2015, 11/26/2018   Influenza-Unspecified 10/27/2017  Outpatient Encounter Medications as of 08/08/2020  Medication Sig Note   Ascorbic Acid (VITAMIN C) 1000 MG tablet Take 2,000 mg by mouth daily.    EPINEPHrine (EPIPEN) 0.3 mg/0.3 mL IJ SOAJ injection Inject 0.3 mLs (0.3 mg total) into the muscle once.    famotidine (PEPCID) 20 MG tablet Take 1 tablet by mouth twice daily    glucosamine-chondroitin 500-400 MG tablet Take 1 tablet by mouth daily.    IBUPROFEN PO Take by mouth. 09/25/2014: PRN   montelukast (SINGULAIR) 10 MG tablet Take 1 tablet by mouth once daily    Multiple Vitamins-Minerals (MULTIVITAMIN WITH MINERALS) tablet Take 1 tablet by mouth daily.    Sodium Chloride-Sodium Bicarb (AYR SALINE NASAL RINSE NA) Place into the nose as needed.    No facility-administered encounter medications on file as of 08/08/2020.     ROS: Gen: no fever, chills  Skin: no rash, itching ENT: no ear pain, ear drainage, nasal congestion, rhinorrhea, sinus pressure, sore throat Eyes: no blurry vision, double  vision Resp: no cough, wheeze,SOB Breast: no breast tenderness, no nipple discharge, no breast masses CV: no CP, palpitations, LE edema,  GI: no heartburn, n/v/d/c, abd pain GU: no dysuria, urgency, frequency, hematuria; + vaginal dryness MSK: no joint pain, myalgias, back pain Neuro: no dizziness, headache, weakness, vertigo Psych: no depression, anxiety, insomnia   Allergies  Allergen Reactions   Wasp Venom    Yellow Jacket Venom [Bee Venom]     BP 110/82 (BP Location: Left Arm, Patient Position: Sitting, Cuff Size: Normal)   Pulse (!) 55   Temp (!) 97.5 F (36.4 C) (Temporal)   Ht 5' 5.25" (1.657 m)   Wt 143 lb 12.8 oz (65.2 kg)   SpO2 98%   BMI 23.75 kg/m  Wt Readings from Last 3 Encounters:  08/08/20 143 lb 12.8 oz (65.2 kg)  02/11/19 150 lb 9.6 oz (68.3 kg)  10/27/18 148 lb 9.6 oz (67.4 kg)   Temp Readings from Last 3 Encounters:  08/08/20 (!) 97.5 F (36.4 C) (Temporal)  02/11/19 (!) 96.6 F (35.9 C) (Temporal)  03/10/18 98.6 F (37 C)   BP Readings from Last 3 Encounters:  08/08/20 110/82  02/11/19 112/80  10/27/18 120/85   Pulse Readings from Last 3 Encounters:  08/08/20 (!) 55  10/27/18 60  03/10/18 (!) 51     Physical Exam Constitutional:      General: She is not in acute distress.    Appearance: She is well-developed.  HENT:     Head: Normocephalic and atraumatic.     Right Ear: Tympanic membrane and ear canal normal.     Left Ear: Tympanic membrane and ear canal normal.     Nose: Nose normal.     Mouth/Throat:     Mouth: Mucous membranes are moist.     Pharynx: Oropharynx is clear.  Eyes:     Conjunctiva/sclera: Conjunctivae normal.  Neck:     Thyroid: No thyromegaly.  Cardiovascular:     Rate and Rhythm: Normal rate and regular rhythm.     Heart sounds: Normal heart sounds. No murmur heard. Pulmonary:     Effort: Pulmonary effort is normal. No respiratory distress.     Breath sounds: Normal breath sounds. No wheezing or rhonchi.   Abdominal:     General: Bowel sounds are normal. There is no distension.     Palpations: Abdomen is soft. There is no mass.     Tenderness: There is no abdominal tenderness.  Musculoskeletal:  Cervical back: Neck supple.     Right lower leg: No edema.     Left lower leg: No edema.  Lymphadenopathy:     Cervical: No cervical adenopathy.  Skin:    General: Skin is warm and dry.  Neurological:     Mental Status: She is alert and oriented to person, place, and time.     Motor: No abnormal muscle tone.     Coordination: Coordination normal.  Psychiatric:        Behavior: Behavior normal.     A/P:  1. Annual physical exam - discussed importance of regular CV exercise, healthy diet, adequate sleep - UTD on PAP, colonoscopy, due for mammo (last in 06/2019) - UTD on dental exam, vision exam in past 1 year - declines Tdap and shingrix vaccines - Basic metabolic panel - CBC - AST - ALT - next CPE in 1 year  2. Screening for lipid disorders - Lipid panel  3. Encounter for vitamin deficiency screening - VITAMIN D 25 Hydroxy (Vit-D Deficiency, Fractures) - B12 - pt requests B12 level  4. Screening for thyroid disorder - TSH   This visit occurred during the SARS-CoV-2 public health emergency.  Safety protocols were in place, including screening questions prior to the visit, additional usage of staff PPE, and extensive cleaning of exam room while observing appropriate contact time as indicated for disinfecting solutions.

## 2020-08-09 LAB — CBC
HCT: 37.2 % (ref 36.0–46.0)
Hemoglobin: 12.7 g/dL (ref 12.0–15.0)
MCHC: 34.2 g/dL (ref 30.0–36.0)
MCV: 89.5 fl (ref 78.0–100.0)
Platelets: 170 10*3/uL (ref 150.0–400.0)
RBC: 4.15 Mil/uL (ref 3.87–5.11)
RDW: 12.6 % (ref 11.5–15.5)
WBC: 3.9 10*3/uL — ABNORMAL LOW (ref 4.0–10.5)

## 2020-09-07 ENCOUNTER — Other Ambulatory Visit: Payer: Self-pay | Admitting: Family Medicine

## 2020-09-07 DIAGNOSIS — J301 Allergic rhinitis due to pollen: Secondary | ICD-10-CM

## 2020-09-07 DIAGNOSIS — K219 Gastro-esophageal reflux disease without esophagitis: Secondary | ICD-10-CM

## 2020-10-10 ENCOUNTER — Other Ambulatory Visit: Payer: Self-pay

## 2020-10-10 DIAGNOSIS — J301 Allergic rhinitis due to pollen: Secondary | ICD-10-CM

## 2020-10-10 MED ORDER — MONTELUKAST SODIUM 10 MG PO TABS: 10.0000 mg | ORAL_TABLET | Freq: Every day | ORAL | 1 refills | Status: DC

## 2020-10-17 ENCOUNTER — Other Ambulatory Visit: Payer: Self-pay

## 2020-10-17 DIAGNOSIS — K219 Gastro-esophageal reflux disease without esophagitis: Secondary | ICD-10-CM

## 2020-10-17 NOTE — Telephone Encounter (Signed)
Received a refill request for : Famotidine 20 mg tabs. LR 08/08/20, # 180, 1 rf ([facility]) LOV 08/08/20 FOV  02/05/21  Spoke to both patient and pharmacy,   [facility] states that patient got a 30 day supply 08/08/20 with no refills.   Please review and advise.  Thanks. Dm/cma

## 2020-10-18 MED ORDER — FAMOTIDINE 20 MG PO TABS: 20.0000 mg | ORAL_TABLET | Freq: Two times a day (BID) | ORAL | 1 refills | Status: DC

## 2020-10-29 ENCOUNTER — Other Ambulatory Visit: Payer: Self-pay

## 2020-10-29 DIAGNOSIS — K219 Gastro-esophageal reflux disease without esophagitis: Secondary | ICD-10-CM

## 2020-10-29 MED ORDER — FAMOTIDINE 20 MG PO TABS: 20.0000 mg | ORAL_TABLET | Freq: Two times a day (BID) | ORAL | 1 refills | Status: DC

## 2021-02-05 ENCOUNTER — Ambulatory Visit (INDEPENDENT_AMBULATORY_CARE_PROVIDER_SITE_OTHER): Payer: Managed Care, Other (non HMO) | Admitting: Nurse Practitioner

## 2021-02-05 ENCOUNTER — Other Ambulatory Visit: Payer: Self-pay

## 2021-02-05 ENCOUNTER — Encounter: Payer: Self-pay | Admitting: Nurse Practitioner

## 2021-02-05 VITALS — BP 98/60 | HR 68 | Temp 96.2°F | Ht 65.5 in | Wt 144.6 lb

## 2021-02-05 DIAGNOSIS — Z91030 Bee allergy status: Secondary | ICD-10-CM

## 2021-02-05 DIAGNOSIS — K219 Gastro-esophageal reflux disease without esophagitis: Secondary | ICD-10-CM

## 2021-02-05 DIAGNOSIS — J309 Allergic rhinitis, unspecified: Secondary | ICD-10-CM | POA: Diagnosis not present

## 2021-02-05 MED ORDER — FAMOTIDINE 20 MG PO TABS: 20.0000 mg | ORAL_TABLET | Freq: Every day | ORAL | 3 refills | Status: DC

## 2021-02-05 MED ORDER — EPINEPHRINE 0.3 MG/0.3ML IJ SOAJ: 0.3000 mg | Freq: Once | INTRAMUSCULAR | 0 refills | Status: AC

## 2021-02-05 MED ORDER — MONTELUKAST SODIUM 10 MG PO TABS: 10.0000 mg | ORAL_TABLET | Freq: Every day | ORAL | 3 refills | Status: DC

## 2021-02-05 NOTE — Assessment & Plan Note (Signed)
Controlled with famotidine once a day Refill sent

## 2021-02-05 NOTE — Progress Notes (Signed)
Subjective:  Patient ID: Shelly Mcgee, female    DOB: 10-Apr-1960  Age: 61 y.o. MRN: [id number]  CC: Establish Care (TOC-Dr. C/Medication refills. )  HPI Transfer from Dr. Bryan Lemma Allergic rhinitis Controlled cough and nasal congestion with montelukast. Refill sent  GERD (gastroesophageal reflux disease) Controlled with famotidine once a day Refill sent  Reviewed past Medical, Social and Family history today.  Outpatient Medications Prior to Visit  Medication Sig Dispense Refill   Ascorbic Acid (VITAMIN C) 1000 MG tablet Take 2,000 mg by mouth daily.     glucosamine-chondroitin 500-400 MG tablet Take 1 tablet by mouth daily.     IBUPROFEN PO Take by mouth.     Multiple Vitamins-Minerals (MULTIVITAMIN WITH MINERALS) tablet Take 1 tablet by mouth daily.     Sodium Chloride-Sodium Bicarb (AYR SALINE NASAL RINSE NA) Place into the nose as needed.     EPINEPHrine (EPIPEN) 0.3 mg/0.3 mL IJ SOAJ injection Inject 0.3 mLs (0.3 mg total) into the muscle once. 2 Device 2   famotidine (PEPCID) 20 MG tablet Take 1 tablet (20 mg total) by mouth 2 (two) times daily. 180 tablet 1   montelukast (SINGULAIR) 10 MG tablet Take 1 tablet (10 mg total) by mouth daily. 90 tablet 1   No facility-administered medications prior to visit.    ROS See HPI  Objective:  BP 98/60 (BP Location: Left Arm, Patient Position: Sitting, Cuff Size: Normal)    Pulse 68    Temp (!) 96.2 F (35.7 C) (Temporal)    Ht 5' 5.5" (1.664 m)    Wt 144 lb 9.6 oz (65.6 kg)    SpO2 97%    BMI 23.70 kg/m   Physical Exam Vitals and nursing note reviewed.  Cardiovascular:     Rate and Rhythm: Normal rate and regular rhythm.     Pulses: Normal pulses.     Heart sounds: Normal heart sounds.  Pulmonary:     Effort: Pulmonary effort is normal.     Breath sounds: Normal breath sounds.  Neurological:     Mental Status: She is alert and oriented to person, place, and time.  Psychiatric:        Mood and Affect: Mood normal.         Behavior: Behavior normal.        Thought Content: Thought content normal.    Assessment & Plan:  This visit occurred during the SARS-CoV-2 public health emergency.  Safety protocols were in place, including screening questions prior to the visit, additional usage of staff PPE, and extensive cleaning of exam room while observing appropriate contact time as indicated for disinfecting solutions.   Shelly Mcgee was seen today for establish care.  Diagnoses and all orders for this visit:  Allergic rhinitis, unspecified seasonality, unspecified trigger -     montelukast (SINGULAIR) 10 MG tablet; Take 1 tablet (10 mg total) by mouth daily.  Gastroesophageal reflux disease without esophagitis -     famotidine (PEPCID) 20 MG tablet; Take 1 tablet (20 mg total) by mouth daily.  Bee sting allergy -     EPINEPHrine (EPIPEN) 0.3 mg/0.3 mL IJ SOAJ injection; Inject 0.3 mg into the muscle once for 1 dose.    Problem List Items Addressed This Visit       Respiratory   Allergic rhinitis - Primary    Controlled cough and nasal congestion with montelukast. Refill sent      Relevant Medications   montelukast (SINGULAIR) 10 MG tablet  Digestive   GERD (gastroesophageal reflux disease)    Controlled with famotidine once a day Refill sent      Relevant Medications   famotidine (PEPCID) 20 MG tablet     Other   Bee sting allergy   Relevant Medications   EPINEPHrine (EPIPEN) 0.3 mg/0.3 mL IJ SOAJ injection    Follow-up: Return in about 6 months (around 08/05/2021) for CPE (fasting).  Wilfred Lacy, NP

## 2021-02-05 NOTE — Assessment & Plan Note (Signed)
Controlled cough and nasal congestion with montelukast. Refill sent
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
## 2021-08-12 ENCOUNTER — Encounter: Payer: Managed Care, Other (non HMO) | Admitting: Nurse Practitioner

## 2021-08-21 ENCOUNTER — Encounter: Payer: Self-pay | Admitting: Nurse Practitioner

## 2021-08-21 ENCOUNTER — Ambulatory Visit (INDEPENDENT_AMBULATORY_CARE_PROVIDER_SITE_OTHER): Payer: Managed Care, Other (non HMO) | Admitting: Nurse Practitioner

## 2021-08-21 VITALS — BP 110/60 | HR 64 | Temp 97.5°F | Ht 65.5 in | Wt 145.0 lb

## 2021-08-21 DIAGNOSIS — Z136 Encounter for screening for cardiovascular disorders: Secondary | ICD-10-CM | POA: Diagnosis not present

## 2021-08-21 DIAGNOSIS — M25561 Pain in right knee: Secondary | ICD-10-CM

## 2021-08-21 DIAGNOSIS — Z0001 Encounter for general adult medical examination with abnormal findings: Secondary | ICD-10-CM | POA: Diagnosis not present

## 2021-08-21 DIAGNOSIS — G8929 Other chronic pain: Secondary | ICD-10-CM

## 2021-08-21 DIAGNOSIS — Z13220 Encounter for screening for lipoid disorders: Secondary | ICD-10-CM

## 2021-08-21 LAB — LIPID PANEL
Cholesterol: 211 mg/dL — ABNORMAL HIGH (ref 0–200)
HDL: 70.8 mg/dL (ref >39.00)
LDL Cholesterol: 116 mg/dL — ABNORMAL HIGH (ref 0–99)
NonHDL: 140.14
Total CHOL/HDL Ratio: 3
Triglycerides: 120 mg/dL (ref 0.0–149.0)
VLDL: 24 mg/dL (ref 0.0–40.0)

## 2021-08-21 LAB — COMPREHENSIVE METABOLIC PANEL
ALT: 17 U/L (ref 0–35)
AST: 20 U/L (ref 0–37)
Albumin: 4.5 g/dL (ref 3.5–5.2)
Alkaline Phosphatase: 67 U/L (ref 39–117)
BUN: 14 mg/dL (ref 6–23)
CO2: 31 mEq/L (ref 19–32)
Calcium: 9.7 mg/dL (ref 8.4–10.5)
Chloride: 105 mEq/L (ref 96–112)
Creatinine, Ser: 0.74 mg/dL (ref 0.40–1.20)
GFR: 88.27 mL/min (ref >60.00)
Glucose, Bld: 93 mg/dL (ref 70–99)
Potassium: 4.1 mEq/L (ref 3.5–5.1)
Sodium: 141 mEq/L (ref 135–145)
Total Bilirubin: 0.7 mg/dL (ref 0.2–1.2)
Total Protein: 7.3 g/dL (ref 6.0–8.3)

## 2021-08-21 NOTE — Patient Instructions (Signed)
Go to lab for blood draw Get knee x-ray completed Schedule appt for mammogram  Preventive Care 59-60 Years Old, Female Preventive care refers to lifestyle choices and visits with your health care provider that can promote health and wellness. Preventive care visits are also called wellness exams. What can I expect for my preventive care visit? Counseling Your health care provider may ask you questions about your: Medical history, including: Past medical problems. Family medical history. Pregnancy history. Current health, including: Menstrual cycle. Method of birth control. Emotional well-being. Home life and relationship well-being. Sexual activity and sexual health. Lifestyle, including: Alcohol, nicotine or tobacco, and drug use. Access to firearms. Diet, exercise, and sleep habits. Work and work Statistician. Sunscreen use. Safety issues such as seatbelt and bike helmet use. Physical exam Your health care provider will check your: Height and weight. These may be used to calculate your BMI (body mass index). BMI is a measurement that tells if you are at a healthy weight. Waist circumference. This measures the distance around your waistline. This measurement also tells if you are at a healthy weight and may help predict your risk of certain diseases, such as type 2 diabetes and high blood pressure. Heart rate and blood pressure. Body temperature. Skin for abnormal spots. What immunizations do I need?  Vaccines are usually given at various ages, according to a schedule. Your health care provider will recommend vaccines for you based on your age, medical history, and lifestyle or other factors, such as travel or where you work. What tests do I need? Screening Your health care provider may recommend screening tests for certain conditions. This may include: Lipid and cholesterol levels. Diabetes screening. This is done by checking your blood sugar (glucose) after you have not  eaten for a while (fasting). Pelvic exam and Pap test. Hepatitis B test. Hepatitis C test. HIV (human immunodeficiency virus) test. STI (sexually transmitted infection) testing, if you are at risk. Lung cancer screening. Colorectal cancer screening. Mammogram. Talk with your health care provider about when you should start having regular mammograms. This may depend on whether you have a family history of breast cancer. BRCA-related cancer screening. This may be done if you have a family history of breast, ovarian, tubal, or peritoneal cancers. Bone density scan. This is done to screen for osteoporosis. Talk with your health care provider about your test results, treatment options, and if necessary, the need for more tests. Follow these instructions at home: Eating and drinking  Eat a diet that includes fresh fruits and vegetables, whole grains, lean protein, and low-fat dairy products. Take vitamin and mineral supplements as recommended by your health care provider. Do not drink alcohol if: Your health care provider tells you not to drink. You are pregnant, may be pregnant, or are planning to become pregnant. If you drink alcohol: Limit how much you have to 0-1 drink a day. Know how much alcohol is in your drink. In the [place] one drink equals one 12 oz bottle of beer (355 mL), one 5 oz glass of wine (148 mL), or one 1 oz glass of hard liquor (44 mL). Lifestyle Brush your teeth every morning and night with fluoride toothpaste. Floss one time each day. Exercise for at least 30 minutes 5 or more days each week. Do not use any products that contain nicotine or tobacco. These products include cigarettes, chewing tobacco, and vaping devices, such as e-cigarettes. If you need help quitting, ask your health care provider. Do not use drugs. If you  are sexually active, practice safe sex. Use a condom or other form of protection to prevent STIs. If you do not wish to become pregnant, use a form of  birth control. If you plan to become pregnant, see your health care provider for a prepregnancy visit. Take aspirin only as told by your health care provider. Make sure that you understand how much to take and what form to take. Work with your health care provider to find out whether it is safe and beneficial for you to take aspirin daily. Find healthy ways to manage stress, such as: Meditation, yoga, or listening to music. Journaling. Talking to a trusted person. Spending time with friends and family. Minimize exposure to UV radiation to reduce your risk of skin cancer. Safety Always wear your seat belt while driving or riding in a vehicle. Do not drive: If you have been drinking alcohol. Do not ride with someone who has been drinking. When you are tired or distracted. While texting. If you have been using any mind-altering substances or drugs. Wear a helmet and other protective equipment during sports activities. If you have firearms in your house, make sure you follow all gun safety procedures. Seek help if you have been physically or sexually abused. What's next? Visit your health care provider once a year for an annual wellness visit. Ask your health care provider how often you should have your eyes and teeth checked. Stay up to date on all vaccines. This information is not intended to replace advice given to you by your health care provider. Make sure you discuss any questions you have with your health care provider. Document Revised: 07/11/2020 Document Reviewed: 07/11/2020 Elsevier Patient Education  [place].

## 2021-08-21 NOTE — Progress Notes (Signed)
Complete physical exam  Patient: Shelly Mcgee   DOB: May 08, 1960   61 y.o. Female  MRN: [id number] Visit Date: 08/21/2021  Subjective:    Chief Complaint  Patient presents with   Annual Exam    CPE Pt not fasting  Rt knee pain    Shelly Mcgee is a 61 y.o. female who presents today for a complete physical exam. She reports consuming a general diet.  Walking 2-26mles 4x/week  She generally feels well. She reports sleeping well. She does not have additional problems to discuss today.  Vision:Yes Dental:Yes STD Screen:No  Most recent fall risk assessment:    08/21/2021    1:03 PM  FEmigrantin the past year? 0  Number falls in past yr: 0  Injury with Fall? 0   Most recent depression screenings:    08/21/2021    1:27 PM 08/08/2020   11:56 AM  PHQ 2/9 Scores  PHQ - 2 Score 0 0  PHQ- 9 Score 0    HPI  Chronic pain of right knee Onset on fall in parking lot at work 04/2020. She tripped on cable. Today she complains of persistent Pain when kneeling. No pain with walking and weight bearing activities. No swelling.  Sent for knee x-ray.  Past Medical History:  Diagnosis Date   Allergy    Anxiety    Asthma    Cervical dysplasia    Endometriosis    Past Surgical History:  Procedure Laterality Date   CESAREAN SECTION     COLONOSCOPY     COLPOSCOPY  1994   DILATION AND CURETTAGE OF UTERUS     ENDOMETRIAL ABLATION     GYNECOLOGIC CRYOSURGERY     LASER ABLATION OF THE CERVIX     PELVIC LAPAROSCOPY     DL laser endometriosis   POLYPECTOMY     Social History   Socioeconomic History   Marital status: Married    Spouse name: Not on file   Number of children: Not on file   Years of education: Not on file   Highest education level: Not on file  Occupational History   Occupation: [employer]--[address]    Employer: [employer]    Comment: 12 hour shift  Tobacco Use   Smoking status: Former    Packs/day: 0.50    Years: 6.00    Total pack  years: 3.00    Types: Cigarettes    Quit date: 08/04/1982    Years since quitting: 39.0   Smokeless tobacco: Never  Vaping Use   Vaping Use: Never used  Substance and Sexual Activity   Alcohol use: Yes    Alcohol/week: 5.0 standard drinks of alcohol    Types: 5 Glasses of wine per week   Drug use: No   Sexual activity: Yes    Partners: Male    Birth control/protection: Surgical    Comment: had ablation  Other Topics Concern   Not on file  Social History Narrative   Exercise-- cycling   Social Determinants of Health   Financial Resource Strain: Not on file  Food Insecurity: Not on file  Transportation Needs: Not on file  Physical Activity: Not on file  Stress: Not on file  Social Connections: Not on file  Intimate Partner Violence: Not on file   Family Status  Relation Name Status   Mother  Deceased   Father  Alive   Sister  (Not Specified)   Brother  (Not  Specified)   Daughter  Alive   Mat Aunt  (Not Specified)   Mat Uncle  (Not Specified)   MGM  (Not Specified)   PGM  (Not Specified)   Neg Hx  (Not Specified)   Family History  Problem Relation Age of Onset   Diabetes Mother    Hypertension Mother    Stomach cancer Mother    Diabetes Father    Arrhythmia Father 77   Thyroid disease Sister    Personality disorder Sister    Diabetes Brother    Diabetes Maternal Aunt    Colon cancer Maternal Aunt 80   Diabetes Maternal Uncle    Diabetes Maternal Grandmother    Thyroid disease Paternal Grandmother    Esophageal cancer Neg Hx    Rectal cancer Neg Hx    Colon polyps Neg Hx    Allergies  Allergen Reactions   Wasp Venom    Yellow Jacket Venom [Bee Venom]     Patient Care Team: Laron Boorman, Charlene Brooke, NP as PCP - General (Internal Medicine)   Medications: Outpatient Medications Prior to Visit  Medication Sig   Ascorbic Acid (VITAMIN C) 1000 MG tablet Take 2,000 mg by mouth daily.   glucosamine-chondroitin 500-400 MG tablet Take 1 tablet by mouth daily.    IBUPROFEN PO Take by mouth.   montelukast (SINGULAIR) 10 MG tablet Take 1 tablet (10 mg total) by mouth daily.   Multiple Vitamins-Minerals (MULTIVITAMIN WITH MINERALS) tablet Take 1 tablet by mouth daily.   famotidine (PEPCID) 20 MG tablet Take 1 tablet (20 mg total) by mouth daily. (Patient not taking: Reported on 08/21/2021)   Sodium Chloride-Sodium Bicarb (AYR SALINE NASAL RINSE NA) Place into the nose as needed. (Patient not taking: Reported on 08/21/2021)   No facility-administered medications prior to visit.    Review of Systems  Constitutional:  Negative for fever.  HENT:  Negative for congestion and sore throat.   Eyes:        Negative for visual changes  Respiratory:  Negative for cough and shortness of breath.   Cardiovascular:  Negative for chest pain, palpitations and leg swelling.  Gastrointestinal:  Negative for blood in stool, constipation and diarrhea.  Genitourinary:  Negative for dysuria, frequency and urgency.  Musculoskeletal:  Positive for arthralgias. Negative for gait problem, joint swelling and myalgias.  Skin:  Negative for rash.  Neurological:  Negative for dizziness and headaches.  Hematological:  Does not bruise/bleed easily.  Psychiatric/Behavioral:  Negative for suicidal ideas. The patient is not nervous/anxious.         Objective:  BP 110/60 (BP Location: Right Arm, Patient Position: Sitting, Cuff Size: Small)   Pulse 64   Temp (!) 97.5 F (36.4 C) (Temporal)   Ht 5' 5.5" (1.664 m)   Wt 145 lb (65.8 kg)   SpO2 97%   BMI 23.76 kg/m       Physical Exam Constitutional:      General: She is not in acute distress. HENT:     Right Ear: Tympanic membrane, ear canal and external ear normal.     Left Ear: Tympanic membrane, ear canal and external ear normal.     Nose: Nose normal.     Mouth/Throat:     Pharynx: No oropharyngeal exudate.  Eyes:     General: No scleral icterus.    Extraocular Movements: Extraocular movements intact.      Conjunctiva/sclera: Conjunctivae normal.  Cardiovascular:     Rate and Rhythm: Normal rate and regular rhythm.  Pulses: Normal pulses.     Heart sounds: Normal heart sounds.  Pulmonary:     Effort: Pulmonary effort is normal. No respiratory distress.     Breath sounds: Normal breath sounds.  Abdominal:     General: There is no distension.     Palpations: Abdomen is soft.  Musculoskeletal:        General: Normal range of motion.     Cervical back: Normal range of motion and neck supple.     Right upper leg: Normal.     Left upper leg: Normal.     Right knee: Normal.     Left knee: Normal.  Lymphadenopathy:     Cervical: No cervical adenopathy.  Skin:    General: Skin is warm and dry.  Neurological:     Mental Status: She is alert and oriented to person, place, and time.  Psychiatric:        Mood and Affect: Mood normal.        Behavior: Behavior normal.        Thought Content: Thought content normal.     No results found for any visits on 08/21/21.    Assessment & Plan:    Routine Health Maintenance and Physical Exam  Immunization History  Administered Date(s) Administered   Influenza,inj,Quad PF,6+ Mos 11/23/2015, 11/26/2018   Influenza-Unspecified 10/27/2017   Health Maintenance  Topic Date Due   MAMMOGRAM  05/15/2021   Zoster Vaccines- Shingrix (1 of 2) 11/21/2021 (Originally 10/03/2011)   TETANUS/TDAP  02/05/2022 (Originally 06/03/2020)   INFLUENZA VACCINE  08/27/2021   PAP SMEAR-Modifier  02/10/2022   COLONOSCOPY (Pts 45-34yr Insurance coverage will need to be confirmed)  03/11/2023   Hepatitis C Screening  Completed   HIV Screening  Completed   HPV VACCINES  Aged Out   COVID-19 Vaccine  Discontinued   Discussed health benefits of physical activity, and encouraged her to engage in regular exercise appropriate for her age and condition. Advised to schedule appt for mammogram.  Problem List Items Addressed This Visit       Other   Chronic pain of right  knee    Onset on fall in parking lot at work 04/2020. She tripped on cable. Today she complains of persistent Pain when kneeling. No pain with walking and weight bearing activities. No swelling.  Sent for knee x-ray.       Relevant Orders   DG Knee 4 Views W/Patella Right   Other Visit Diagnoses     Encounter for preventative adult health care exam with abnormal findings    -  Primary   Relevant Orders   Comprehensive metabolic panel   Lipid panel   Encounter for lipid screening for cardiovascular disease       Relevant Orders   Lipid panel      Return in about 1 year (around 08/22/2022) for CPE (fasting).     CWilfred Lacy NP

## 2021-08-21 NOTE — Assessment & Plan Note (Signed)
Onset on fall in parking lot at work 04/2020. She tripped on cable. Today she complains of persistent Pain when kneeling. No pain with walking and weight bearing activities. No swelling.  Sent for knee x-ray.

## 2021-08-22 ENCOUNTER — Ambulatory Visit
Admission: RE | Admit: 2021-08-22 | Discharge: 2021-08-22 | Disposition: A | Discharge status: Home / Self Care | Payer: Managed Care, Other (non HMO) | Source: Ambulatory Visit | Attending: Nurse Practitioner | Admitting: Nurse Practitioner

## 2021-08-22 DIAGNOSIS — G8929 Other chronic pain: Secondary | ICD-10-CM

## 2021-08-30 ENCOUNTER — Encounter: Payer: Self-pay | Admitting: Nurse Practitioner

## 2021-08-30 DIAGNOSIS — G8929 Other chronic pain: Secondary | ICD-10-CM

## 2021-09-16 ENCOUNTER — Encounter: Payer: Self-pay | Admitting: Nurse Practitioner

## 2021-09-18 ENCOUNTER — Other Ambulatory Visit: Payer: Self-pay

## 2021-09-18 DIAGNOSIS — J309 Allergic rhinitis, unspecified: Secondary | ICD-10-CM

## 2021-09-18 DIAGNOSIS — K219 Gastro-esophageal reflux disease without esophagitis: Secondary | ICD-10-CM

## 2021-09-18 MED ORDER — MONTELUKAST SODIUM 10 MG PO TABS: 10.0000 mg | ORAL_TABLET | Freq: Every day | ORAL | 3 refills | Status: DC

## 2021-09-18 MED ORDER — FAMOTIDINE 20 MG PO TABS: 20.0000 mg | ORAL_TABLET | Freq: Every day | ORAL | 3 refills | Status: DC

## 2021-10-23 ENCOUNTER — Other Ambulatory Visit: Payer: Self-pay | Admitting: Physician Assistant

## 2021-10-23 ENCOUNTER — Ambulatory Visit
Admission: RE | Admit: 2021-10-23 | Discharge: 2021-10-23 | Disposition: A | Discharge status: Home / Self Care | Payer: Managed Care, Other (non HMO) | Source: Ambulatory Visit | Attending: Physician Assistant | Admitting: Physician Assistant

## 2021-10-23 DIAGNOSIS — M2241 Chondromalacia patellae, right knee: Secondary | ICD-10-CM

## 2022-07-24 ENCOUNTER — Other Ambulatory Visit: Payer: Self-pay | Admitting: Nurse Practitioner

## 2022-07-24 DIAGNOSIS — J309 Allergic rhinitis, unspecified: Secondary | ICD-10-CM

## 2022-08-01 ENCOUNTER — Other Ambulatory Visit: Payer: Self-pay

## 2022-08-01 DIAGNOSIS — J309 Allergic rhinitis, unspecified: Secondary | ICD-10-CM

## 2022-08-01 MED ORDER — MONTELUKAST SODIUM 10 MG PO TABS: 10.0000 mg | ORAL_TABLET | Freq: Every day | ORAL | 3 refills | Status: DC

## 2022-08-19 ENCOUNTER — Other Ambulatory Visit: Payer: Self-pay | Admitting: Nurse Practitioner

## 2022-08-19 DIAGNOSIS — K219 Gastro-esophageal reflux disease without esophagitis: Secondary | ICD-10-CM

## 2022-08-25 ENCOUNTER — Telehealth: Payer: Self-pay | Admitting: Family Medicine

## 2022-08-25 NOTE — Telephone Encounter (Signed)
Pt called stating that she would like to look into transferring her care back to Dr. Laury Axon. Pt stated that she felt ike Dr. Laury Axon was a better fit for her and would like to re-establish care with her. Please Advise.

## 2022-08-29 ENCOUNTER — Telehealth: Payer: Self-pay | Admitting: Nurse Practitioner

## 2022-08-29 ENCOUNTER — Other Ambulatory Visit: Payer: Self-pay

## 2022-08-29 DIAGNOSIS — K219 Gastro-esophageal reflux disease without esophagitis: Secondary | ICD-10-CM

## 2022-08-29 MED ORDER — FAMOTIDINE 20 MG PO TABS: 20.0000 mg | ORAL_TABLET | Freq: Every day | ORAL | 0 refills | Status: DC

## 2022-08-29 NOTE — Telephone Encounter (Signed)
Sent!

## 2022-08-29 NOTE — Telephone Encounter (Signed)
Prescription Request  08/29/2022  LOV: Visit date not found  What is the name of the medication or equipment? famotidine famotidine (PEPCID) 20 MG tablet  Have you contacted your pharmacy to request a refill? Yes   Which pharmacy would you like this sent to?   [facility] - [place], [place] - [address] [address] [place] [address] Phone: [phone] Fax: [phone]    Patient notified that their request is being sent to the clinical staff for review and that they should receive a response within 2 business days.   Please advise at Mobile [phone] (mobile)

## 2022-08-29 NOTE — Telephone Encounter (Signed)
FYI

## 2022-08-29 NOTE — Telephone Encounter (Signed)
Pt called and scheduled for TOC

## 2022-09-25 ENCOUNTER — Encounter: Payer: Self-pay | Admitting: Family Medicine

## 2022-09-25 ENCOUNTER — Ambulatory Visit: Payer: Managed Care, Other (non HMO) | Admitting: Family Medicine

## 2022-09-25 VITALS — BP 134/84 | HR 59 | Temp 97.8°F | Resp 18 | Ht 65.5 in | Wt 148.0 lb

## 2022-09-25 DIAGNOSIS — J309 Allergic rhinitis, unspecified: Secondary | ICD-10-CM

## 2022-09-25 DIAGNOSIS — K219 Gastro-esophageal reflux disease without esophagitis: Secondary | ICD-10-CM | POA: Diagnosis not present

## 2022-09-25 DIAGNOSIS — Z0000 Encounter for general adult medical examination without abnormal findings: Secondary | ICD-10-CM | POA: Diagnosis not present

## 2022-09-25 DIAGNOSIS — Z13220 Encounter for screening for lipoid disorders: Secondary | ICD-10-CM | POA: Diagnosis not present

## 2022-09-25 LAB — CBC WITH DIFFERENTIAL/PLATELET
Basophils Absolute: 0 10*3/uL (ref 0.0–0.1)
Basophils Relative: 1 % (ref 0.0–3.0)
Eosinophils Absolute: 0 10*3/uL (ref 0.0–0.7)
Eosinophils Relative: 0.7 % (ref 0.0–5.0)
HCT: 40.8 % (ref 36.0–46.0)
Hemoglobin: 13.4 g/dL (ref 12.0–15.0)
Lymphocytes Relative: 28.4 % (ref 12.0–46.0)
Lymphs Abs: 1.2 10*3/uL (ref 0.7–4.0)
MCHC: 32.8 g/dL (ref 30.0–36.0)
MCV: 90.1 fl (ref 78.0–100.0)
Monocytes Absolute: 0.3 10*3/uL (ref 0.1–1.0)
Monocytes Relative: 7.4 % (ref 3.0–12.0)
Neutro Abs: 2.6 10*3/uL (ref 1.4–7.7)
Neutrophils Relative %: 62.5 % (ref 43.0–77.0)
Platelets: 204 10*3/uL (ref 150.0–400.0)
RBC: 4.52 Mil/uL (ref 3.87–5.11)
RDW: 12.8 % (ref 11.5–15.5)
WBC: 4.1 10*3/uL (ref 4.0–10.5)

## 2022-09-25 LAB — COMPREHENSIVE METABOLIC PANEL
ALT: 13 U/L (ref 0–35)
AST: 16 U/L (ref 0–37)
Albumin: 4.4 g/dL (ref 3.5–5.2)
Alkaline Phosphatase: 73 U/L (ref 39–117)
BUN: 16 mg/dL (ref 6–23)
CO2: 30 mEq/L (ref 19–32)
Calcium: 9.9 mg/dL (ref 8.4–10.5)
Chloride: 104 mEq/L (ref 96–112)
Creatinine, Ser: 0.76 mg/dL (ref 0.40–1.20)
GFR: 84.84 mL/min (ref >60.00)
Glucose, Bld: 93 mg/dL (ref 70–99)
Potassium: 4.7 mEq/L (ref 3.5–5.1)
Sodium: 141 mEq/L (ref 135–145)
Total Bilirubin: 0.6 mg/dL (ref 0.2–1.2)
Total Protein: 7 g/dL (ref 6.0–8.3)

## 2022-09-25 LAB — LIPID PANEL
Cholesterol: 197 mg/dL (ref 0–200)
HDL: 66 mg/dL (ref >39.00)
LDL Cholesterol: 107 mg/dL — ABNORMAL HIGH (ref 0–99)
NonHDL: 131.27
Total CHOL/HDL Ratio: 3
Triglycerides: 120 mg/dL (ref 0.0–149.0)
VLDL: 24 mg/dL (ref 0.0–40.0)

## 2022-09-25 LAB — TSH: TSH: 1.74 u[IU]/mL (ref 0.35–5.50)

## 2022-09-25 MED ORDER — FAMOTIDINE 20 MG PO TABS: 20.0000 mg | ORAL_TABLET | Freq: Two times a day (BID) | ORAL | 3 refills | Status: AC

## 2022-09-25 MED ORDER — MONTELUKAST SODIUM 10 MG PO TABS: 10.0000 mg | ORAL_TABLET | Freq: Every day | ORAL | 3 refills | Status: DC

## 2022-09-25 NOTE — Assessment & Plan Note (Signed)
Ghm utd Check labs  See [facility] Health Maintenance  Topic Date Due   PAP SMEAR-Modifier  02/10/2022   Zoster Vaccines- Shingrix (1 of 2) 12/26/2022 (Originally 10/03/2011)   INFLUENZA VACCINE  04/27/2023 (Originally 08/28/2022)   MAMMOGRAM  09/25/2023 (Originally 05/15/2021)   Colonoscopy  03/11/2023   Hepatitis C Screening  Completed   HIV Screening  Completed   HPV VACCINES  Aged Out   DTaP/Tdap/Td  Discontinued   COVID-19 Vaccine  Discontinued

## 2022-09-25 NOTE — Patient Instructions (Signed)
Preventive Care 40-61 Years Old, Female Preventive care refers to lifestyle choices and visits with your health care provider that can promote health and wellness. Preventive care visits are also called wellness exams. What can I expect for my preventive care visit? Counseling Your health care provider may ask you questions about your: Medical history, including: Past medical problems. Family medical history. Pregnancy history. Current health, including: Menstrual cycle. Method of birth control. Emotional well-being. Home life and relationship well-being. Sexual activity and sexual health. Lifestyle, including: Alcohol, nicotine or tobacco, and drug use. Access to firearms. Diet, exercise, and sleep habits. Work and work environment. Sunscreen use. Safety issues such as seatbelt and bike helmet use. Physical exam Your health care provider will check your: Height and weight. These may be used to calculate your BMI (body mass index). BMI is a measurement that tells if you are at a healthy weight. Waist circumference. This measures the distance around your waistline. This measurement also tells if you are at a healthy weight and may help predict your risk of certain diseases, such as type 2 diabetes and high blood pressure. Heart rate and blood pressure. Body temperature. Skin for abnormal spots. What immunizations do I need?  Vaccines are usually given at various ages, according to a schedule. Your health care provider will recommend vaccines for you based on your age, medical history, and lifestyle or other factors, such as travel or where you work. What tests do I need? Screening Your health care provider may recommend screening tests for certain conditions. This may include: Lipid and cholesterol levels. Diabetes screening. This is done by checking your blood sugar (glucose) after you have not eaten for a while (fasting). Pelvic exam and Pap test. Hepatitis B test. Hepatitis C  test. HIV (human immunodeficiency virus) test. STI (sexually transmitted infection) testing, if you are at risk. Lung cancer screening. Colorectal cancer screening. Mammogram. Talk with your health care provider about when you should start having regular mammograms. This may depend on whether you have a family history of breast cancer. BRCA-related cancer screening. This may be done if you have a family history of breast, ovarian, tubal, or peritoneal cancers. Bone density scan. This is done to screen for osteoporosis. Talk with your health care provider about your test results, treatment options, and if necessary, the need for more tests. Follow these instructions at home: Eating and drinking  Eat a diet that includes fresh fruits and vegetables, whole grains, lean protein, and low-fat dairy products. Take vitamin and mineral supplements as recommended by your health care provider. Do not drink alcohol if: Your health care provider tells you not to drink. You are pregnant, may be pregnant, or are planning to become pregnant. If you drink alcohol: Limit how much you have to 0-1 drink a day. Know how much alcohol is in your drink. In the [place] one drink equals one 12 oz bottle of beer (355 mL), one 5 oz glass of wine (148 mL), or one 1 oz glass of hard liquor (44 mL). Lifestyle Brush your teeth every morning and night with fluoride toothpaste. Floss one time each day. Exercise for at least 30 minutes 5 or more days each week. Do not use any products that contain nicotine or tobacco. These products include cigarettes, chewing tobacco, and vaping devices, such as e-cigarettes. If you need help quitting, ask your health care provider. Do not use drugs. If you are sexually active, practice safe sex. Use a condom or other form of protection to   prevent STIs. If you do not wish to become pregnant, use a form of birth control. If you plan to become pregnant, see your health care provider for a  prepregnancy visit. Take aspirin only as told by your health care provider. Make sure that you understand how much to take and what form to take. Work with your health care provider to find out whether it is safe and beneficial for you to take aspirin daily. Find healthy ways to manage stress, such as: Meditation, yoga, or listening to music. Journaling. Talking to a trusted person. Spending time with friends and family. Minimize exposure to UV radiation to reduce your risk of skin cancer. Safety Always wear your seat belt while driving or riding in a vehicle. Do not drive: If you have been drinking alcohol. Do not ride with someone who has been drinking. When you are tired or distracted. While texting. If you have been using any mind-altering substances or drugs. Wear a helmet and other protective equipment during sports activities. If you have firearms in your house, make sure you follow all gun safety procedures. Seek help if you have been physically or sexually abused. What's next? Visit your health care provider once a year for an annual wellness visit. Ask your health care provider how often you should have your eyes and teeth checked. Stay up to date on all vaccines. This information is not intended to replace advice given to you by your health care provider. Make sure you discuss any questions you have with your health care provider. Document Revised: 07/11/2020 Document Reviewed: 07/11/2020 Elsevier Patient Education  2024 [employer] Inc.  

## 2022-09-25 NOTE — Progress Notes (Signed)
Established Patient Office Visit  Subjective   Patient ID: Shelly Mcgee, female    DOB: [birth year]  Age: 62 y.o. MRN: [id number]  Chief Complaint  Patient presents with   Transitions Of Care    HPI Discussed the use of AI scribe software for clinical note transcription with the patient, who gave verbal consent to proceed.  History of Present Illness   The patient, with a history of acid reflux and allergies, presents for an annual physical and prescription refills. She reports no new health issues, but mentions postmenopausal vaginal dryness causing painful intercourse. She declines hormonal treatment and expresses disinterest in a laser treatment option. She is advised to try over-the-counter vaginal moisturizers.  The patient's acid reflux and allergies are reportedly well-managed with famotidine and montelukast, respectively. She requests an increase in the famotidine dosage from once to twice daily. She also mentions a chronic cough, which has improved with the current medication regimen.  The patient also reports a recent episode of a full-body rash after sitting on a driveway, suspecting an allergic reaction to ants. She carries an EpiPen for known wasp allergies. She also mentions a previous shingles outbreak, managed with Valtrex.  Lastly, the patient mentions a pulled muscle in the shoulder area, which still causes discomfort. This is on the same side as a previously diagnosed bulging disc. She expresses a desire to do more yoga for overall health and symptom management.      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   GERD (gastroesophageal reflux disease) 02/05/2021   Preventative health care 11/12/2012   Chronic cough 11/12/2012   Anxiety    Cervical dysplasia    Endometriosis    Allergic rhinitis 02/09/2007   Past Medical History:  Diagnosis Date   Allergy    Anxiety    Asthma     Cervical dysplasia    Endometriosis    Past Surgical History:  Procedure Laterality Date   CESAREAN SECTION     COLONOSCOPY     COLPOSCOPY  1994   DILATION AND CURETTAGE OF UTERUS     ENDOMETRIAL ABLATION     GYNECOLOGIC CRYOSURGERY     LASER ABLATION OF THE CERVIX     PELVIC LAPAROSCOPY     DL laser endometriosis   POLYPECTOMY     Social History   Tobacco Use   Smoking status: Former    Current packs/day: 0.00    Average packs/day: 0.5 packs/day for 6.0 years (3.0 ttl pk-yrs)    Types: Cigarettes    Start date: 08/03/1976    Quit date: 08/04/1982    Years since quitting: 40.1   Smokeless tobacco: Never  Vaping Use   Vaping status: Never Used  Substance Use Topics   Alcohol use: Yes    Alcohol/week: 5.0 standard drinks of alcohol    Types: 5 Glasses of wine per week   Drug use: No   Social History   Socioeconomic History   Marital status: Married    Spouse name: Not on file   Number of children: Not on file   Years of education: Not on file   Highest education level: Not on file  Occupational History   Occupation: [employer]--[address]    Employer: [employer]    Comment: 12 hour shift  Tobacco Use   Smoking status: Former    Current packs/day: 0.00  Average packs/day: 0.5 packs/day for 6.0 years (3.0 ttl pk-yrs)    Types: Cigarettes    Start date: 08/03/1976    Quit date: 08/04/1982    Years since quitting: 40.1   Smokeless tobacco: Never  Vaping Use   Vaping status: Never Used  Substance and Sexual Activity   Alcohol use: Yes    Alcohol/week: 5.0 standard drinks of alcohol    Types: 5 Glasses of wine per week   Drug use: No   Sexual activity: Yes    Partners: Male    Birth control/protection: Surgical    Comment: had ablation  Other Topics Concern   Not on file  Social History Narrative   Exercise-- cycling, walking    Social Determinants of Health   Financial Resource Strain: Not on file  Food Insecurity: Not on file   Transportation Needs: Not on file  Physical Activity: Not on file  Stress: Not on file  Social Connections: Not on file  Intimate Partner Violence: Not on file   Family Status  Relation Name Status   Mother  Deceased   Father  Alive   Sister  (Not Specified)   Brother  (Not Specified)   Daughter  Alive   Mat Aunt  (Not Specified)   Nurse, mental health  (Not Specified)   MGM  (Not Specified)   PGM  (Not Specified)   Neg Hx  (Not Specified)  No partnership data on file   Family History  Problem Relation Age of Onset   Diabetes Mother    Hypertension Mother    Stomach cancer Mother    Diabetes Father    Arrhythmia Father 39   Thyroid disease Sister    Personality disorder Sister    Diabetes Brother    Diabetes Maternal Aunt    Colon cancer Maternal Aunt 54   Diabetes Maternal Uncle    Diabetes Maternal Grandmother    Thyroid disease Paternal Grandmother    Esophageal cancer Neg Hx    Rectal cancer Neg Hx    Colon polyps Neg Hx    Allergies  Allergen Reactions   Wasp Venom    Yellow Jacket Venom [Bee Venom]       Review of Systems  Constitutional:  Negative for chills, fever and malaise/fatigue.  HENT:  Negative for congestion and hearing loss.   Eyes:  Negative for blurred vision and discharge.  Respiratory:  Negative for cough, sputum production and shortness of breath.   Cardiovascular:  Negative for chest pain, palpitations and leg swelling.  Gastrointestinal:  Negative for abdominal pain, blood in stool, constipation, diarrhea, heartburn, nausea and vomiting.  Genitourinary:  Negative for dysuria, frequency, hematuria and urgency.  Musculoskeletal:  Negative for back pain, falls and myalgias.  Skin:  Negative for rash.  Neurological:  Negative for dizziness, sensory change, loss of consciousness, weakness and headaches.  Endo/Heme/Allergies:  Negative for environmental allergies. Does not bruise/bleed easily.  Psychiatric/Behavioral:  Negative for depression and  suicidal ideas. The patient is not nervous/anxious and does not have insomnia.       Objective:     BP 134/84 (BP Location: Left Arm, Patient Position: Sitting, Cuff Size: Normal)   Pulse (!) 59   Temp 97.8 F (36.6 C) (Oral)   Resp 18   Ht 5' 5.5" (1.664 m)   Wt 148 lb (67.1 kg)   SpO2 98%   BMI 24.25 kg/m  BP Readings from Last 3 Encounters:  09/25/22 134/84  08/21/21 110/60  02/05/21 98/60  Wt Readings from Last 3 Encounters:  09/25/22 148 lb (67.1 kg)  08/21/21 145 lb (65.8 kg)  02/05/21 144 lb 9.6 oz (65.6 kg)   SpO2 Readings from Last 3 Encounters:  09/25/22 98%  08/21/21 97%  02/05/21 97%      Physical Exam Vitals and nursing note reviewed.  Constitutional:      General: She is not in acute distress.    Appearance: Normal appearance. She is well-developed.  HENT:     Head: Normocephalic and atraumatic.     Right Ear: Tympanic membrane, ear canal and external ear normal. There is no impacted cerumen.     Left Ear: Tympanic membrane, ear canal and external ear normal. There is no impacted cerumen.     Nose: Nose normal.     Mouth/Throat:     Mouth: Mucous membranes are moist.     Pharynx: Oropharynx is clear. No oropharyngeal exudate or posterior oropharyngeal erythema.  Eyes:     General: No scleral icterus.       Right eye: No discharge.        Left eye: No discharge.     Conjunctiva/sclera: Conjunctivae normal.     Pupils: Pupils are equal, round, and reactive to light.  Neck:     Thyroid: No thyromegaly or thyroid tenderness.     Vascular: No JVD.  Cardiovascular:     Rate and Rhythm: Normal rate and regular rhythm.     Heart sounds: Normal heart sounds. No murmur heard. Pulmonary:     Effort: Pulmonary effort is normal. No respiratory distress.     Breath sounds: Normal breath sounds.  Abdominal:     General: Bowel sounds are normal. There is no distension.     Palpations: Abdomen is soft. There is no mass.     Tenderness: There is no  abdominal tenderness. There is no guarding or rebound.  Genitourinary:    Vagina: Normal.  Musculoskeletal:        General: Normal range of motion.     Cervical back: Normal range of motion and neck supple.     Right lower leg: No edema.     Left lower leg: No edema.  Lymphadenopathy:     Cervical: No cervical adenopathy.  Skin:    General: Skin is warm and dry.     Findings: No erythema or rash.  Neurological:     Mental Status: She is alert and oriented to person, place, and time.     Cranial Nerves: No cranial nerve deficit.     Deep Tendon Reflexes: Reflexes are normal and symmetric.  Psychiatric:        Mood and Affect: Mood normal.        Behavior: Behavior normal.        Thought Content: Thought content normal.        Judgment: Judgment normal.      No results found for any visits on 09/25/22.  Last CBC Lab Results  Component Value Date   WBC 3.9 (L) 08/08/2020   HGB 12.7 08/08/2020   HCT 37.2 08/08/2020   MCV 89.5 08/08/2020   MCH 30.4 07/10/2011   RDW 12.6 08/08/2020   PLT 170.0 08/08/2020   Last metabolic panel Lab Results  Component Value Date   GLUCOSE 93 08/21/2021   NA 141 08/21/2021   K 4.1 08/21/2021   CL 105 08/21/2021   CO2 31 08/21/2021   BUN 14 08/21/2021   CREATININE 0.74 08/21/2021   GFR 88.27  08/21/2021   CALCIUM 9.7 08/21/2021   PROT 7.3 08/21/2021   ALBUMIN 4.5 08/21/2021   BILITOT 0.7 08/21/2021   ALKPHOS 67 08/21/2021   AST 20 08/21/2021   ALT 17 08/21/2021   Last lipids Lab Results  Component Value Date   CHOL 211 (H) 08/21/2021   HDL 70.80 08/21/2021   LDLCALC 116 (H) 08/21/2021   TRIG 120.0 08/21/2021   CHOLHDL 3 08/21/2021   Last hemoglobin A1c Lab Results  Component Value Date   HGBA1C 5.3 07/10/2011   Last thyroid functions Lab Results  Component Value Date   TSH 1.68 08/08/2020   Last vitamin D Lab Results  Component Value Date   VD25OH 50.14 08/08/2020   Last vitamin B12 and Folate Lab Results   Component Value Date   VITAMINB12 528 08/08/2020      The 10-year ASCVD risk score (Arnett DK, et al., 2019) is: 3.2%    Assessment & Plan:   Problem List Items Addressed This Visit       Unprioritized   Allergic rhinitis   Relevant Medications   montelukast (SINGULAIR) 10 MG tablet   Preventative health care - Primary    Ghm utd Check labs  See AV[facility] Health Maintenance  Topic Date Due   PAP SMEAR-Modifier  02/10/2022   Zoster Vaccines- Shingrix (1 of 2) 12/26/2022 (Originally 10/03/2011)   INFLUENZA VACCINE  04/27/2023 (Originally 08/28/2022)   MAMMOGRAM  09/25/2023 (Originally 05/15/2021)   Colonoscopy  03/11/2023   Hepatitis C Screening  Completed   HIV Screening  Completed   HPV VACCINES  Aged Out   DTaP/Tdap/Td  Discontinued   COVID-19 Vaccine  Discontinued         Relevant Orders   CBC with Differential/Platelet   Comprehensive metabolic panel   Lipid panel   TSH   GERD (gastroesophageal reflux disease)    Stable  Con't pepcid      Relevant Medications   famotidine (PEPCID) 20 MG tablet  Assessment and Plan    Postmenopausal Dyspareunia Painful intercourse due to vaginal dryness. Patient declined hormonal therapy and MonaLisa laser treatment. -Recommended over-the-counter vaginal moisturizers.  Gastroesophageal Reflux Disease (GERD) Managed with Famotidine. Patient requests increase in dosage due to persistent symptoms. -Increase Famotidine to twice daily. -Refill prescription via [facility].  Asthma Managed with Montelukast. Patient reports chronic cough, improved with current medication. -Continue Montelukast. -Consider trial of antihistamine (Claritin or Zyrtec) to manage potential allergy-related postnasal drip.  General Health Maintenance -Order labs for hemoglobin, liver function, kidney function, glucose, thyroid function, and cholesterol. -Recommend bone density scan to assess for osteoporosis risk. -Plan Pap smear for next  year's visit. -Refill Montelukast and Famotidine prescriptions via [facility].        Return in about 1 year (around 09/25/2023), or if symptoms worsen or fail to improve, for fasting, annual exam.    Donato Schultz, DO

## 2022-09-25 NOTE — Assessment & Plan Note (Signed)
Stable  Con't pepcid

## 2023-09-21 ENCOUNTER — Encounter: Payer: Self-pay | Admitting: Emergency Medicine

## 2023-09-21 ENCOUNTER — Ambulatory Visit
Admission: EM | Admit: 2023-09-21 | Discharge: 2023-09-21 | Disposition: A | Discharge status: Home / Self Care | Attending: Internal Medicine | Admitting: Internal Medicine

## 2023-09-21 DIAGNOSIS — H60393 Other infective otitis externa, bilateral: Secondary | ICD-10-CM

## 2023-09-21 DIAGNOSIS — H6123 Impacted cerumen, bilateral: Secondary | ICD-10-CM | POA: Diagnosis not present

## 2023-09-21 MED ORDER — OFLOXACIN 0.3 % OT SOLN: 10.0000 [drp] | Freq: Every day | OTIC | 0 refills | Status: AC

## 2023-09-21 NOTE — Discharge Instructions (Signed)
 You have an ear infection of the ear canal known as otitis externa. Use ear drops as prescribed for 7 days. Do not place anything smaller than elbow deep into ear canal- this includes Q-tips. You may place a small amount of rubbing alcohol onto the end of a Q-tip and place this into the outer ear canal to dry up any remaining water that may have gotten into the ear while showering or submerging head underwater to prevent this type of infection in the future.   For ear wax, stop using Q tips as this pushes wax further into the canal and causes obstruction. Use debrox ear drops purchased over the counter as needed for wax impactions. You may always return to urgent care to have us  clean out your ears again if the ear drops do not soften the wax enough for it to come out on its own at home!   If you develop any new or worsening symptoms or if your symptoms do not start to improve, please return here or follow-up with your primary care provider. If your symptoms are severe, please go to the emergency room.

## 2023-09-21 NOTE — ED Provider Notes (Signed)
 [facility]    CSN: [id number] Arrival date & time: 09/21/23  0800      History   Chief Complaint Chief Complaint  Patient presents with   Foreign Body in Ear    HPI Shelly Mcgee is a 63 y.o. female.   Shelly Mcgee is a 63 y.o. female presenting for chief complaint of Foreign Body in Ear. She placed silicone into the left ear canal while she was in [place] at bedtime on 09/12/2023 (9 days ago). She was able to remove most of the silicone ear plug but some of it is still stuck in the left ear canal causing muffled hearing. This has been in the left ear canal for the last several days, she just got back to the [place]  last night.  She went swimming in [place] without the ear plugs in and wonders if symptoms could be due to swimmers ear. She bought some OTC swimmers ear otic drops and has been using them without relief.  Denies fever, chills, neck pain, dizziness, and tinnitus. Her husband was able to scoop out some of the silicone but stopped because it was painful.    Foreign Body in Ear    Past Medical History:  Diagnosis Date   Allergy    Anxiety    Asthma    Cervical dysplasia    Endometriosis     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   GERD (gastroesophageal reflux disease) 02/05/2021   Preventative health care 11/12/2012   Chronic cough 11/12/2012   Anxiety    Cervical dysplasia    Endometriosis    Allergic rhinitis 02/09/2007    Past Surgical History:  Procedure Laterality Date   CESAREAN SECTION     COLONOSCOPY     COLPOSCOPY  1994   DILATION AND CURETTAGE OF UTERUS     ENDOMETRIAL ABLATION     GYNECOLOGIC CRYOSURGERY     LASER ABLATION OF THE CERVIX     PELVIC LAPAROSCOPY     DL laser endometriosis   POLYPECTOMY      OB History     Gravida  2   Para  1   Term  1   Preterm      AB  1   Living  1      SAB  1   IAB      Ectopic       Multiple      Live Births               Home Medications    Prior to Admission medications   Medication Sig Start Date End Date Taking? Authorizing Provider  ofloxacin  (FLOXIN ) 0.3 % OTIC solution Place 10 drops into both ears daily. 09/21/23  Yes Enedelia Dorna CHRISTELLA, FNP  Ascorbic Acid (VITAMIN C) 1000 MG tablet Take 2,000 mg by mouth daily.    [provider]  famotidine  (PEPCID ) 20 MG tablet Take 1 tablet (20 mg total) by mouth 2 (two) times daily. 09/25/22   Antonio Cyndee Jamee JONELLE, DO  glucosamine-chondroitin 500-400 MG tablet Take 1 tablet by mouth daily.    [provider]  IBUPROFEN PO Take by mouth.    [provider]  montelukast  (SINGULAIR ) 10 MG tablet Take 1 tablet (10 mg total) by mouth daily. 09/25/22   Antonio Cyndee Jamee JONELLE, DO  Multiple Vitamins-Minerals (MULTIVITAMIN WITH MINERALS) tablet Take 1  tablet by mouth daily.    [provider]  Sodium Chloride -Sodium Bicarb (AYR SALINE NASAL RINSE NA) Place into the nose as needed. Patient not taking: Reported on 08/21/2021    [provider]    Family History Family History  Problem Relation Age of Onset   Diabetes Mother    Hypertension Mother    Stomach cancer Mother    Diabetes Father    Arrhythmia Father 54   Thyroid  disease Sister    Personality disorder Sister    Diabetes Brother    Diabetes Maternal Aunt    Colon cancer Maternal Aunt 45   Diabetes Maternal Uncle    Diabetes Maternal Grandmother    Thyroid  disease Paternal Grandmother    Esophageal cancer Neg Hx    Rectal cancer Neg Hx    Colon polyps Neg Hx     Social History Social History   Tobacco Use   Smoking status: Former    Current packs/day: 0.00    Average packs/day: 0.5 packs/day for 6.0 years (3.0 ttl pk-yrs)    Types: Cigarettes    Start date: 08/03/1976    Quit date: 08/04/1982    Years since quitting: 41.1   Smokeless tobacco: Never  Vaping Use   Vaping status: Never Used  Substance Use  Topics   Alcohol use: Yes    Alcohol/week: 5.0 standard drinks of alcohol    Types: 5 Glasses of wine per week   Drug use: No     Allergies   Wasp venom and Yellow jacket venom [bee venom]   Review of Systems Review of Systems Per HPI  Physical Exam Triage Vital Signs ED Triage Vitals  Encounter Vitals Group     BP 09/21/23 0812 (!) 159/97     Girls Systolic BP Percentile --      Girls Diastolic BP Percentile --      Boys Systolic BP Percentile --      Boys Diastolic BP Percentile --      Pulse Rate 09/21/23 0812 (!) 58     Resp 09/21/23 0812 17     Temp 09/21/23 0812 97.6 F (36.4 C)     Temp Source 09/21/23 0812 Oral     SpO2 09/21/23 0812 97 %     Weight --      Height --      Head Circumference --      Peak Flow --      Pain Score 09/21/23 0817 3     Pain Loc --      Pain Education --      Exclude from Growth Chart --    No data found.  Updated Vital Signs BP (!) 159/97 (BP Location: Right Arm)   Pulse (!) 58   Temp 97.6 F (36.4 C) (Oral)   Resp 17   SpO2 97%   Visual Acuity Right Eye Distance:   Left Eye Distance:   Bilateral Distance:    Right Eye Near:   Left Eye Near:    Bilateral Near:     Physical Exam Vitals and nursing note reviewed.  Constitutional:      Appearance: She is not ill-appearing or toxic-appearing.  HENT:     Head: Normocephalic and atraumatic.     Right Ear: Hearing, tympanic membrane, ear canal and external ear normal. There is impacted cerumen.     Left Ear: Hearing, tympanic membrane, ear canal and external ear normal. There is impacted cerumen (Impacted cerumen and evidence of thick  crusty yellow/white material to the left ear canal obstructing view of TM).     Nose: Nose normal.     Mouth/Throat:     Lips: Pink.  Eyes:     General: Lids are normal. Vision grossly intact. Gaze aligned appropriately.     Extraocular Movements: Extraocular movements intact.     Conjunctiva/sclera: Conjunctivae normal.  Pulmonary:      Effort: Pulmonary effort is normal.  Musculoskeletal:     Cervical back: Neck supple.  Skin:    General: Skin is warm and dry.     Capillary Refill: Capillary refill takes less than 2 seconds.     Findings: No rash.  Neurological:     General: No focal deficit present.     Mental Status: She is alert and oriented to person, place, and time. Mental status is at baseline.     Cranial Nerves: No dysarthria or facial asymmetry.  Psychiatric:        Mood and Affect: Mood normal.        Speech: Speech normal.        Behavior: Behavior normal.        Thought Content: Thought content normal.        Judgment: Judgment normal.      UC Treatments / Results  Labs (all labs ordered are listed, but only abnormal results are displayed) Labs Reviewed - No data to display  EKG   Radiology No results found.  Procedures Procedures (including critical care time)  Medications Ordered in UC Medications - No data to display  Initial Impression / Assessment and Plan / UC Course  I have reviewed the triage vital signs and the nursing notes.  Pertinent labs & imaging results that were available during my care of the patient were reviewed by me and considered in my medical decision making (see chart for details).   1. Infective otitis externa of both ears, bilateral impacted cerumen Presentation consistent with otitis externa worsened by bilateral cerumen impaction as well.  Ear lavage to both ear canals performed by RN with minimal removal of debris/wax. I was able to use a lighted curette to further attempt to remove some of the debris/wax with minimal success.  Will manage this with Ofloxacin  otic drops as prescribed for 7 days since I am unable to see the eardrum of the affected ear. Encouraged to avoid getting water into affected ear(s) for at least 7-10 days. Cotton ball to the external ear during showers to avoid water into the ear canal. Over the counter medications as needed for  pain.   Counseled patient on potential for adverse effects with medications prescribed/recommended today, strict ER and return-to-clinic precautions discussed, patient verbalized understanding.    Final Clinical Impressions(s) / UC Diagnoses   Final diagnoses:  Infective otitis externa of both ears  Bilateral impacted cerumen     Discharge Instructions      You have an ear infection of the ear canal known as otitis externa. Use ear drops as prescribed for 7 days. Do not place anything smaller than elbow deep into ear canal- this includes Q-tips. You may place a small amount of rubbing alcohol onto the end of a Q-tip and place this into the outer ear canal to dry up any remaining water that may have gotten into the ear while showering or submerging head underwater to prevent this type of infection in the future.   For ear wax, stop using Q tips as this pushes wax further into  the canal and causes obstruction. Use debrox ear drops purchased over the counter as needed for wax impactions. You may always return to urgent care to have us  clean out your ears again if the ear drops do not soften the wax enough for it to come out on its own at home!   If you develop any new or worsening symptoms or if your symptoms do not start to improve, please return here or follow-up with your primary care provider. If your symptoms are severe, please go to the emergency room.     ED Prescriptions     Medication Sig Dispense Auth. Provider   ofloxacin  (FLOXIN ) 0.3 % OTIC solution Place 10 drops into both ears daily. 5 mL Enedelia Dorna HERO, FNP      PDMP not reviewed this encounter.   Enedelia Dorna [place], [place] 09/21/23 [phone]

## 2023-09-21 NOTE — ED Triage Notes (Signed)
 Pt states she was in [place] last week and used silicone ear plug and thinks she has a piece stuck in ear since the 8/16. C/o sharp intermittent left ear pain and fullness.

## 2023-09-26 ENCOUNTER — Telehealth: Payer: Self-pay | Admitting: Emergency Medicine

## 2023-09-26 NOTE — Telephone Encounter (Signed)
 Pt called states she was still having ear fullness and want to know if she possibly needed oral abx. Pt advised to try debrox drops or follow up with PCP/ENT if ear fullness persist.

## 2023-12-10 ENCOUNTER — Other Ambulatory Visit: Payer: Self-pay

## 2023-12-10 DIAGNOSIS — J309 Allergic rhinitis, unspecified: Secondary | ICD-10-CM

## 2023-12-10 MED ORDER — MONTELUKAST SODIUM 10 MG PO TABS: 10.0000 mg | ORAL_TABLET | Freq: Every day | ORAL | 0 refills | Status: AC

## 2024-03-25 ENCOUNTER — Ambulatory Visit: Admitting: Family Medicine

## 2024-03-28 ENCOUNTER — Encounter: Admitting: Family Medicine
